# Patient Record
Sex: Male | Born: 1940 | Race: Black or African American | Hispanic: No | Marital: Married | Smoking: Former smoker
Health system: Southern US, Community
[De-identification: ages and names within clinical notes are randomized; demographics above are authoritative.]

## PROBLEM LIST (undated history)

## (undated) DIAGNOSIS — I219 Acute myocardial infarction, unspecified: Secondary | ICD-10-CM

## (undated) DIAGNOSIS — I10 Essential (primary) hypertension: Secondary | ICD-10-CM

## (undated) HISTORY — PX: OTHER SURGICAL HISTORY: SHX169

---

## 2003-07-06 ENCOUNTER — Inpatient Hospital Stay (HOSPITAL_COMMUNITY): Admission: EM | Admit: 2003-07-06 | Discharge: 2003-07-08 | Payer: Self-pay | Admitting: Emergency Medicine

## 2003-07-06 ENCOUNTER — Encounter: Payer: Self-pay | Admitting: Emergency Medicine

## 2003-07-07 ENCOUNTER — Encounter: Payer: Self-pay | Admitting: Cardiology

## 2007-07-16 ENCOUNTER — Ambulatory Visit: Payer: Self-pay | Admitting: Thoracic Surgery (Cardiothoracic Vascular Surgery)

## 2007-07-29 ENCOUNTER — Ambulatory Visit (HOSPITAL_COMMUNITY)
Admission: RE | Admit: 2007-07-29 | Discharge: 2007-07-29 | Payer: Self-pay | Admitting: Thoracic Surgery (Cardiothoracic Vascular Surgery)

## 2007-07-29 ENCOUNTER — Encounter: Payer: Self-pay | Admitting: Thoracic Surgery (Cardiothoracic Vascular Surgery)

## 2007-07-31 ENCOUNTER — Inpatient Hospital Stay (HOSPITAL_COMMUNITY)
Admission: RE | Admit: 2007-07-31 | Discharge: 2007-08-05 | Payer: Self-pay | Admitting: Thoracic Surgery (Cardiothoracic Vascular Surgery)

## 2007-07-31 ENCOUNTER — Ambulatory Visit: Payer: Self-pay | Admitting: Thoracic Surgery (Cardiothoracic Vascular Surgery)

## 2007-07-31 ENCOUNTER — Encounter: Payer: Self-pay | Admitting: Thoracic Surgery (Cardiothoracic Vascular Surgery)

## 2007-08-07 ENCOUNTER — Emergency Department (HOSPITAL_COMMUNITY): Admission: EM | Admit: 2007-08-07 | Discharge: 2007-08-07 | Payer: Self-pay | Admitting: Emergency Medicine

## 2007-08-25 ENCOUNTER — Ambulatory Visit: Payer: Self-pay | Admitting: Thoracic Surgery (Cardiothoracic Vascular Surgery)

## 2007-08-25 ENCOUNTER — Encounter
Admission: RE | Admit: 2007-08-25 | Discharge: 2007-08-25 | Payer: Self-pay | Admitting: Thoracic Surgery (Cardiothoracic Vascular Surgery)

## 2011-04-24 NOTE — Discharge Summary (Signed)
[name], ROSETE              ACCOUNT NO.:  [id number]   MEDICAL RECORD NO.:  [id number]          PATIENT TYPE:  INP   LOCATION:  2037                         FACILITY:  [facility]   PHYSICIAN:  Salvatore Decent. Cornelius Moras, M.D. DATE OF BIRTH:  [birth year]   DATE OF ADMISSION:  07/31/2007  DATE OF DISCHARGE:                               DISCHARGE SUMMARY   FINAL DIAGNOSIS:  Severe three-vessel coronary artery disease with  progressive symptoms of angina and mild to moderate left ventricular  dysfunction.   IN HOSPITAL DIAGNOSES:  1. Volume overload postoperatively.  2. Acute blood loss anemia postoperatively.   SECONDARY DIAGNOSES:  1. Hypertension.  2. Longstanding tobacco abuse.  3. Chronic low back pain with degenerative disk disease of lumbosacral      spine.  4. Status post back surgery x6.   IN HOSPITAL OPERATIONS AND PROCEDURES:  Coronary artery bypass grafting  x3 using a left internal mammary artery to distal left anterior  descending coronary artery, saphenous vein graft to ramus intermediate  branch, saphenous vein graft to first circumflex marginal branch,  endoscopic saphenous vein harvest from bilateral thighs and left lower  leg.   HISTORY OF PRESENT ILLNESS/HOSPITAL COURSE:  The patient is a 70-year-  old male with known previous history of coronary artery disease.  Risk  factors notable for history of hypertension, longstanding tobacco abuse  as well as a family history of coronary artery disease.  The patient  presents with a 33-month history of progressive symptoms of exertional  chest pain consistent with angina.  He underwent a nuclear stress test  that was abnormal.  The patient was then taken for cardiac  catheterization by  Dr. Georganna Skeans July 10, 2007.  The patient was found to have severe three-  vessel coronary artery disease with mild to moderate left ventricular  dysfunction.  Following catheterization Dr. Cornelius Moras was consulted.  Dr.  Cornelius Moras saw and evaluated the  patient in the office.  He discussed with the  patient undergoing coronary artery bypass grafting.  Risks and benefits  discussed with the patient.  The patient acknowledged understanding and  agreed to proceed.  Surgery was scheduled for July 31, 2007.  For  details of the patient's Past Medical History and Physical Exam, please  see dictated H&P.   The patient was taken to the operating room July 31, 2007 where he  underwent coronary artery bypass grafting x3 using a left internal  mammary artery to the distal left anterior descending coronary artery,  saphenous vein graft to ramus intermediate branch, saphenous vein graft  to first circumflex marginal branch.  Endoscopic saphenous vein harvest  from bilateral thighs and left lower leg.  The patient tolerated this  procedure.  Was transferred to the intensive care unit in stable  condition.  Postoperatively the patient was noted to be hemodynamically  stable.  He was extubated the evening of surgery.  Post extubation the  patient noted to be alert and oriented x4.  Neurological intact.  The  patient's postoperative course pretty much unremarkable.  Postop day #1  his vital signs are noted to be  stable.  He was able to be weaned from  all drips.  Swan-Ganz catheter DC'd in normal fashion.  Chest x-ray was  noted to be stable.  Minimal drainage from chest tube.  Chest tube CT  postop day #1.  The patient continued to progress well on the intensive  care unit.  His vital signs continued to remain stable.  He was out of  bed ambulating well.  Diet remained stable.  The patient was transferred  up to 2000 postop day #2.  While on telemetry floor the patient  continued to progress well.  He remained in normal sinus rhythm.  Pulmonary status remained stable.  Incisions were clean, dry, and intact  and healing well.  The patient continued to ambulate well with cardiac  rehab.  He was tolerating diet well.  No nausea, vomiting noted.   Postoperatively the patient did have slight acute blood loss anemia.  Postop day #1 hemoglobin and hematocrit were 8.6 and 24.9%.  The patient  was asymptomatic, and this was followed closely.  This did improve and  on postop day #3 was 9.8 and 28.5%.  The patient also developed slight  volume overload postoperatively.  He was started on diuretics to  continue while in the hospital.  The patient's weight was back near  baseline prior to discharge home.  Prior to discharge the patient  remained afebrile.  He was able to be weaned off oxygen satting greater  than 98% on room air.   LABORATORY DATA:  Laboratories postop day #3 showed a white count 11.3,  hemoglobin 9.85, hematocrit 28.5.  Sodium 139, potassium 4.2, chloride  98, bicarb 33, BUN 18, creatinine 1.17, glucose 121.   The patient is tentatively ready for discharge home in the a.m. postop  day #5, August 05, 2007.   FOLLOW-UP APPOINTMENTS:  Follow-up appointment has been arranged with  Dr. Cornelius Moras for August 25, 2007 at 1:15 p.m..  The patient will need to  obtain PMI chest x-ray 30 minutes prior to this appointment.  The  patient will need to follow up with  Dr. Georganna Skeans in two weeks.  He will need to contact Dr. Walden Field office  to make these arrangements.   ACTIVITY:  The patient instructed no driving until released to do so, no  heavy lifting over 10 pounds.  He is told to ambulate 3-4 times per day,  progress as tolerated, and continue his breathing exercises.   INCISIONAL CARE:  The patient told to shower washing his incisions using  soap and water.  He is to contact the office if he develops any drainage  or opening from any of his incision sites.   DIET:  The patient educated on diet to be low fat, low salt.   DISCHARGE MEDICATIONS:  1. Aspirin 81 mg daily.  2. Toprol XL 100 mg daily.  3. Lipitor 10 mg at night.  4. Plavix 75 mg daily.  5. Lasix 40 mg daily x5 days.  6. Potassium chloride 200 mg daily x5 days.   7. Ranitidine 150 mg b.i.d.  8. Benazepril/HCTZ 20/25 mg daily.  9. Clonazepam 1 mg at night as needed.  10.Oxycodone 5 mg 1-2 tabs q.4-6 h. p.r.n. pain.      Theda Belfast, PA      Salvatore Decent. Cornelius Moras, M.D.  Electronically Signed    KMD/MEDQ  D:  08/04/2007  T:  08/05/2007  Job:  [id number]   cc:   Nance Pew

## 2011-04-24 NOTE — Op Note (Signed)
NAMEDEQUON, SCHNEBLY              ACCOUNT NO.:  [id number]   MEDICAL RECORD NO.:  [id number]          PATIENT TYPE:  INP   LOCATION:  2311                         FACILITY:  [facility]   PHYSICIAN:  Salvatore Decent. Cornelius Moras, M.D. DATE OF BIRTH:  1940-12-12   DATE OF PROCEDURE:  07/31/2007  DATE OF DISCHARGE:                               OPERATIVE REPORT   PREOPERATIVE DIAGNOSIS:  Severe three-vessel coronary artery disease  with progressive symptoms of angina pectoris and mild to moderate left  ventricular dysfunction.   POSTOPERATIVE DIAGNOSIS:  Severe three-vessel coronary artery disease  with progressive symptoms of angina pectoris and mild to moderate left  ventricular dysfunction.   PROCEDURE:  Median sternotomy for coronary artery bypass grafting x3  (left internal mammary artery to distal left anterior descending  coronary artery, saphenous vein graft to ramus intermediate branch,  saphenous vein graft to first circumflex marginal branch, endoscopic  saphenous vein harvest from bilateral thighs and left lower leg).   SURGEON:  Salvatore Decent. Cornelius Moras, M.D.   ASSISTANT:  Rowe Clack, P.A.-C.   ANESTHESIA:  General.   BRIEF CLINICAL NOTE:  The patient is a 70 year old male with no previous  history of coronary artery disease but risk factors notable for history  of hypertension and longstanding tobacco abuse as well as a family  history of coronary artery disease.  The patient presents with a 43-month  history of progressive symptoms of exertional chest pain consistent with  angina pectoris.  He underwent a nuclear stress test that was abnormal.  The patient underwent elective cardiac catheterization by Dr. Danella Maiers on July 10, 2007.  He was found to have severe three-vessel  coronary artery disease with mild to moderate left ventricular  dysfunction.  A full consultation has been dictated previously.  The  patient and his wife have been counseled at length regarding the  indications, risks, and potential benefits of surgery.  Alternative  treatment strategies have been discussed.  They understand and accept  all associated risks of surgery and desire to proceed as described.   OPERATIVE FINDINGS:  1. Diffuse coronary artery disease with relatively poor target vessels      for grafting.  The terminal branches of the distal right coronary      artery were small, diffusely diseased, and not graftable.  The      diagonal branch off OF the left anterior descending coronary artery      was not graftable due to severe calcified atherosclerotic plaque.      The second circumflex marginal branch was not graftable.  2. Mild to moderate left ventricular dysfunction with ejection      fraction estimated at 40%.  3. Somewhat small-caliber left internal mammary artery that was      otherwise good-quality with excellent flow.  4. Small-caliber saphenous vein, much of which was too small to be      utilized as conduit for grafting.  Subsequently, all of the greater      saphenous vein from both thighs and the left lower leg was removed.      Below the  knee is a saphenous vein was all too small to be utilized      for grafting.  5. The patient probably should not be considered a candidate for redo      coronary artery bypass surgery in the future due to the very      diffuse nature of his coronary artery disease with lack of target      vessels for grafting and limited options for conduit.   OPERATIVE NOTE IN DETAIL:  The patient is brought to the operating room  on the above-mentioned date and central monitoring is established by the  anesthesia service under the care and direction of Dr. Sheldon Silvan.  Specifically, a Swan-Ganz catheter is placed through the right internal  jugular approach.  A radial arterial line is placed.  Intravenous  antibiotics are administered.  Following induction with general  endotracheal anesthesia, a Foley catheter is placed.  The  patient's  chest, abdomen, both groins, and both lower extremities are prepared and  draped in a sterile manner.  Baseline transesophageal echocardiogram is  performed by Dr. Ivin Booty.  The patient is noted to have mild to moderate  left ventricular dysfunction with ejection fraction estimated at 40%.  There is severe hypokinesis in the inferior wall.  There is mild mitral  regurgitation.   A median sternotomy incision is performed and the left internal mammary  artery is dissected from the chest wall and prepared for bypass  grafting.  The left internal mammary artery is somewhat small-caliber  but otherwise good-quality.  Simultaneously saphenous vein is obtained  initially from the left thigh and the left lower leg using endoscopic  vein harvest technique.  There is only one segment of saphenous vein  from the left thigh which is large enough to be utilized as conduit for  grafting.  Approximately half of the way down the thigh the vein becomes  small and below the knee, the vein is very small.  Subsequently an  additional segment of saphenous vein is obtained from the patient's  right thigh using endoscopic vein harvest technique.  The vein on the  right side is similar and at the level of the knee it becomes too small  to be utilized for grafting.  The portions of the saphenous vein which  are utilized for surgery are satisfactory conduit for grafting.  After  the saphenous vein is removed, the small incisions in both lower  extremities are closed in multiple layers with running absorbable  suture.  The patient is heparinized systemically and the left internal  mammary artery transected distally.  It is noted to have good flow.   The pericardium is opened.  The ascending aorta is mildly sclerotic but  otherwise normal in appearance.  The ascending aorta and the right  atrium are cannulated for cardiopulmonary bypass.  Adequate  heparinization is verified.  Cardiopulmonary bypass is  begun and the  surface of the heart is inspected.  Distal sites are selected for  coronary bypass grafting.  The patient has very diffuse coronary artery  disease with hard, calcified plaque throughout much of his epicardial  coronary arteries.  The terminal branches of the distal right coronary  artery are all tiny and the distal right coronary artery itself is  chronically occluded.  There are no suitable target vessels for grafting  in this vascular territory.  The diagonal branch off the left anterior  descending coronary artery is severely diseased and heavily calcified,  and there are no  areas suitable for grafting.   A temperature probe is placed in the left ventricular septum.  A  cardioplegia catheter is placed in the ascending aorta.  The patient is  allowed to cool passively to 32 degrees' systemic temperature.  The  aortic crossclamp is applied and cold blood cardioplegia is administered  in an antegrade fashion through the aortic root.  Iced saline slush is  applied for topical hypothermia.  The initial cardioplegic arrest and  myocardial cooling are felt to be excellent.  Repeat doses of  cardioplegia are administered intermittently throughout the crossclamp  portion of the operation through the aortic root and down the  subsequently-placed vein grafts to maintain left ventricular septal  temperature below 15 degrees centigrade.   The following distal coronary anastomoses are performed:  1. The first circumflex marginal branch is grafted with a saphenous      vein graft in an end-to-side fashion.  This vessel measures 1.5 mm      in diameter and is a good-quality target vessel for grafting at the      site of distal anastomosis.  2. The ramus intermediate branch is grafted with a saphenous vein      graft in an end-to-side fashion.  This vessel measures 1.8 mm in      diameter and is a good-quality target vessel for grafting.  It is      diffusely diseased proximally and  intramyocardial distally.  3. The distal left anterior descending coronary artery is grafted with      the left internal mammary artery in an end-to-side fashion.  This      vessel is small and only a fair-quality target vessel for grafting.      It measures one 1.2 mm in diameter at the distal anastomoses.  A      1.0 probe will pass in both directions and all way to the apex of      the heart.   Both proximal saphenous vein anastomoses are performed directly to the  ascending aorta prior to removal of the aortic crossclamp.  The left  ventricular septal temperature rises appropriately with reperfusion of  the left internal mammary artery.  The aortic crossclamp is removed  after a total crossclamp time of 84 minutes.  All proximal and distal  anastomoses are inspected for hemostasis and appropriate graft  orientation.  The patient's heart began to beat spontaneously without  need for cardioversion.  Epicardial pacing wires are affixed to the  right ventricular outflow tract and to the right atrial appendage.  Low-  dose dopamine infusion is begun.  The patient is weaned from  cardiopulmonary bypass on a low-dose dopamine infusion.  Total  cardiopulmonary bypass time for the operation is 116 minutes.  Follow-up  transesophageal echocardiogram initially following separation from  bypass demonstrates mild to moderate mitral regurgitation with  persistent inferior wall hypokinesis.  This improves and mitral  regurgitation resolves as the patient stabilizes following separation  from bypass.  Prior to transport, there is trivial residual mitral  regurgitation.   The venous and arterial cannulae are removed uneventfully.  Protamine is  administered to reverse the anticoagulation.  The mediastinum and the  left chest are irrigated with saline solution containing vancomycin.  Meticulous surgical hemostasis is ascertained.  The mediastinum and both  left and right pleural spaces are drained  with four chest tubes exited  through separate stab incisions inferiorly.  The pericardium and soft  tissues anterior to the aorta  are reapproximated loosely.  The On-Q  continuous pain management system is utilized to facilitate  postoperative pain control.  Two 10-inch catheters supplied with the On-  Q kit are tunneled into the deep subcutaneous tissues and positioned  just lateral to the lateral border of the sternum on either side.  Each  catheter is flushed with 5 mL of 0.5% bupivacaine solution and  ultimately they are connected to a continuous infusion pump.  The  sternum was closed with double-strength sternal wire.  The soft tissues  anterior to the sternum are closed in multiple layers and the skin is  closed with a running subcuticular skin closure.   The patient tolerated the procedure well and is transported to the  surgical intensive care unit in stable condition.  There are no  intraoperative complications.  All sponge, instrument and needle counts  are verified correct at completion of the operation.  No blood products  were administered.      Salvatore Decent. Cornelius Moras, M.D.  Electronically Signed     CHO/MEDQ  D:  07/31/2007  T:  08/01/2007  Job:  [id number]   cc:   Danella Maiers, MD  Lorelei Pont, MD

## 2011-04-24 NOTE — Assessment & Plan Note (Signed)
OFFICE VISIT   [name], [name]  DOB:  07-31-1941                                        July 16, 2007  CHART #:  [id number]   HISTORY OF PRESENT ILLNESS:  [name] is a 70 year old disabled  African-American male from [place], [place] with no previous history of  coronary artery disease, but risk factors notable for history of  hypertension and long standing tobacco abuse. The patient also has a  family history of coronary artery disease. The patient has been disabled  for several years due to chronic pain in his lower back. 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 As part of his  rehab from these procedures, the patient has been on a walking regimen,  and over the last four months he has noticed progressive symptoms of  chest pain that initially was brought on with walking. He describes  these pains as a tightness that radiates across his chest and sometimes  down his left arm. They are sometimes associated with sharp pains that  typically radiate from right to left. The patient also has some  epigastric burning pain that seems to come and go. Typically, the  patient's symptoms are relieved with rest. Recently, he has had some  nocturnal symptoms that have awoken him from his sleep. He denies any  prolonged episodes of chest pain lasting more than 5 or 10 minutes at  the most. He does report some progressive exertional shortness of  breath. He denies resting shortness of breath, PND, orthopnea, or lower  extremity edema.   He underwent a nuclear stress test on July 07, 2007. The patient did not  develop chest pain during the exam, nor did he develop significant EKG  changes. However, there was an area of apical ischemia on nuclear  imaging. Resting ejection fraction was 65%. The patient subsequently  underwent elective cardiac catheterization by Dr. Danella Maiers on July 10, 2007. He was found to have severe three vessel coronary artery  disease with mild to moderate left ventricular dysfunction. Coronary  anatomy was felt to be unfavorable for percutaneous coronary  intervention. [name] has now been referred for possible elective  surgical revascularization.   REVIEW OF SYSTEMS:  GENERAL:  The patient reports normal appetite. He  has lost 6 or 8 pounds over the last several months, which he attributes  to his exercise program.  CARDIAC:  Notable for progressive symptoms of angina. These have become  more frequent, now occurring nearly every day. The patient reports mild  exertional shortness of breath, functional class 2. The patient denies  resting shortness of breath, PND, orthopnea, or lower extremity edema.  The patient reports occasional tachy-palpitations. He denies any dizzy  spells or syncopal episodes.  RESPIRATORY:  Notable for intermittent dry non-productive cough that  seems to come and go sporadically. The patient denies productive cough,  hemoptysis, or wheezing.  GASTROINTESTINAL:  Notable for some symptoms of epigastric burning pain  that has been attributed to indigestion. The patient has no difficulty  swallowing. He denies hematochezia, hematemesis, melena. He has some  mild chronic constipation.  GENITOURINARY:  Negative. The patient denies hematuria or urinary  urgency or frequency.  PERIPHERAL VASCULAR:  Negative. The patient denies  symptoms suggestive  of claudication.  NEGATIVE:  Negative. The patient denies transient numbness or weakness  involving either the upper or lower extremity.  MUSCULOSKELETAL:  Notable for chronic pain in his lower back that has  limited his activity. He is up and ambulatory, and he is getting around  reasonably well presently, although he still has chronic pain in his  back.  PSYCHIATRIC:  Negative. The patient denies problems with anxiety or  depression.  HEENT:  Notable in  that the patient has a partial set of dentures with 4  or 5 remaining teeth. He denies any ongoing dental issues.   PAST MEDICAL HISTORY:  1. Hypertension.  2. Long standing tobacco abuse.  3. 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   PAST SURGICAL HISTORY:  Back surgery x6.   FAMILY HISTORY:  The patient's father died of a myocardial infarction  age 11; several other family members have coronary artery disease.   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 The patient is married  and lives with his wife in [place]. They have four grown children.  He has smoked cigarettes for the past 50 years averaging at least one  pack of cigarettes per day, although the patient reports that he has cut  back recently. The patient denies excessive alcohol consumption.   CURRENT MEDICATIONS:  1. Ranitidine 150 mg twice daily.  2. Reglan 10 mg before meals and at bedtime.  3. Benazepril/hydrochlorothiazide 20/25 mg 1 tablet every morning.  4. Clonazepam 1 mg at bedtime.   DRUG ALLERGIES:  None known.   PHYSICAL EXAMINATION:  GENERAL:  The patient is a well appearing African-  American male who appears slightly older than his stated age, but in no  acute distress.  VITAL SIGNS:  Blood pressure 154/93, pulse 91, oxygen saturation 96% on  room air.  HEENT:  Grossly unrevealing.  NECK:  Supple. There is no cervical or supraclavicular lymphadenopathy.  There is no jugular venous distension.  CHEST:  Auscultation of the chest demonstrates clear breath sounds which  are symmetrical bilaterally. There is no wheezing or rhonchi noted.  CARDIOVASCULAR:  Regular rate and rhythm. No murmurs, rubs, or gallops  are appreciated.  ABDOMEN:  Mildly obese, but soft and nontender. Bowel sounds are  present. There are no palpable masses.  EXTREMITIES:  Warm and adequately profuse. There  is no lower extremity  edema. Distal pulses are diminished in both lower legs at the ankle.  There is no sign of significant venous insufficiency.  SKIN:  Clean, dry, healthy appearing throughout with no open skin  lesions or ulcerations. There are a few patches of discoloration  consistent with tinea versicolor.  NEUROLOGIC:  Grossly non-focal and symmetrical throughout.  RECTAL:  Deferred.  GU:  Deferred.   DIAGNOSTIC TESTS:  Cardiac catheterization performed by Dr. Georganna Skeans on  July 31 is reviewed. This demonstrates severe three vessel coronary  artery disease with mild to moderate left ventricular dysfunction.  Specifically, there is diffuse disease involving the left anterior  descending coronary artery with discreet 90% stenosis of the proximal  portion of this vessel. There is 100% chronic occlusion of the left  anterior descending coronary artery just after take off of a medium  sized second diagonal branch. There is left to left collateral filling  of the distal left anterior descending coronary artery which wraps the  apex at the heart. There is 90% proximal stenosis of the large first  circumflex marginal branch and long segment 80% proximal stenosis of the  large second circumflex marginal branch. There is 70% diffuse long  segment proximal stenosis of a medium to large sized ramus intermediate  branch. There is 100% chronic occlusion of the right coronary artery.  Left ventricular function is mildly reduced. There is antra apical and  inferior apical hypokinesis. Ejection fraction is estimated 45% to 50%.  Left ventricular and diastolic pressure with 14 millimeters of mercury.   IMPRESSION:  Severe three vessel coronary artery disease with  progressive symptoms of angina pectoris now with nocturnal symptoms.  There is mild left ventricular dysfunction. Coronary anatomy would not  suitable for percutaneous coronary intervention. I believe that Mr.  Serrano would best  treated with coronary artery bypass grafting.   PLAN:  I have discussed the options at length with [name] and his  family here in the office today. Alternative treatment strategies have  been discussed. They understand and accept all of the associated risks  of surgery including, but not limited to, the risk of death, stroke,  myocardial infarction, congestive heart failure, respiratory failure,  pneumonia, bleeding requiring blood transfusion, arrhythmia, infection,  and recurrent coronary artery disease. All of their questions have been  addressed. We tentatively plan to proceed with surgery on Thursday,  August 21. [name] has been prescriptions for several new  medications to take between now and the time of surgery including Toprol  XL 25 mg daily, Imdur 30 mg daily, and Ultram tablets as necessary for  chronic back pain. He has been advised to take enteric coated aspirin 81  mg daily. He has also been given a prescription for nitroglycerin  sublingual capsules to be utilized should he develop chest pain at rest.  He has been instructed to contact EMS and get to the nearest emergency  room if he develops a prolonged episode of chest pain unrelieved with  sublingual nitroglycerin. All of his questions have been addressed.   Salvatore Decent. Cornelius Moras, M.D.  Electronically Signed   CHO/MEDQ  D:  07/16/2007  T:  07/17/2007  Job:  [id number]   cc:   Danella Maiers, MD  Lorelei Pont, MD

## 2011-04-24 NOTE — Assessment & Plan Note (Signed)
OFFICE VISIT   [name], [name]  DOB:  [birth year]                                        August 25, 2007  CHART #:  [id number]   REPORT TITLE:  OFFICE NOTE   HISTORY OF PRESENT ILLNESS:  The patient returns for a routine followup  status post coronary artery bypass grafting times 3 on 07/31/2007.  His  postoperative recovery has been uneventful.  Findings at the time of  surgery were notable for diffuse coronary artery disease, with  relatively poor target vessels for grafting.  In addition, he had  relatively small caliber saphenous vein, and as such he would not be  considered a candidate for redo coronary artery bypass surgery in the  future due to the very diffuse nature of his history coronary artery  disease with limited options for conduit.  His postoperative recovery  has been uneventful.  Following hospital discharge he seems to have  continued to slowly improve.  He has been seen in followup by Dr.  Georganna Skeans who changed his Lipitor to lovastatin in an effort to save  financially.  The patient returns to our office today for routine  followup.  Overall he has no specific complaints.  He has mild residual  soreness in his chest that continues to slowly improve.  He has not had  any shortness of breath.  He is eating fairly well, and he wants to  extend his diet to include some of the foods that he has been advised to  try to stay away from.  He has had some mild soreness in his legs as  well, particularly his right thigh, but this also seems to improve the  more and more that he has been active.  He denies any fevers or chills.  He is sleeping well at night.  The remainder of his review of systems is  unrevealing.   PHYSICAL EXAMINATION:  GENERAL AND VITAL SIGNS:  Physical exam is  notable for a well-appearing male, with blood pressure 157/86, pulse 65,  oxygen saturation 97% on room air.  CHEST:  Examination is notable for  median sternotomy  incision that is healing nicely.  The sternum is  stable on palpation.  Breath sounds are clear to auscultation  bilaterally.  No wheezes are rhonchi are noted.  CARDIOVASCULAR:  Exam  includes a regular rate and rhythm.  No murmurs, rubs, or gallops are  appreciated.  ABDOMEN:  Soft, and nontender.  EXTREMITIES:  Warm and  well perfused.  The small incisions from bilateral endoscopic vein  harvest have healed nicely.  There is no lower extremity edema.  The  remainder of his physical exam is unremarkable.   IMPRESSION:  Satisfactory progress following recent coronary artery  bypass grafting.   PLAN:  I have encouraged the patient to continue to gradually increase  his activity as tolerated, with his only limitation at this point  remaining that he refrain from heavy lifting or strenuous use of his  arms or shoulders for at least another 2 months.  I have encouraged him  to consider enrolling in the cardiac rehab program, although he seems  reluctant to do so given the fact that he does not have any health  insurance.  I have instructed him to increase his activity and to try to  make an effort  to walk at least 2 or 3 times every day.  All of his  questions have been addressed.  We have not made any changes in his  current medications.  He was sent home on Plavix due to concerns  regarding his very diffuse coronary artery disease, with the potential  for premature graft failure, and/or premature progression of his  underlying coronary artery disease.  There is no hard or fast data to  support this use, and given his limited financial resources the  potential risks may outweigh the benefits.  We will leave this decision  to the discretion of Dr. Georg[facility].  In the future the  patient will call and return to see use here at [facility] only should further problems or difficulties arise.   Salvatore Decent. Cornelius Moras, M.D.  Electronically Signed   CHO/MEDQ   D:  08/25/2007  T:  08/25/2007  Job:  [id number]   cc:   Danella Maiers, MD  Lorelei Pont, MD

## 2011-04-27 NOTE — Discharge Summary (Signed)
[name], [name]                          ACCOUNT NO.:  [id number]   MEDICAL RECORD NO.:  [id number]                   PATIENT TYPE:  INP   LOCATION:  0347                                 FACILITY:  [facility]   PHYSICIAN:  Darden Palmer., M.D.         DATE OF BIRTH:  [birth year]   DATE OF ADMISSION:  07/06/2003  DATE OF DISCHARGE:  07/08/2003                                 DISCHARGE SUMMARY   FINAL DIAGNOSES:  1. Chest pain of uncertain etiology, myocardial infarction ruled out.  2. Cholelithiasis.  3. Renal cyst.  4. Hypertension.   PROCEDURE:  Echocardiogram Cardiolite scan.   HISTORY:  The patient is a 70 year old black male with a prior history of  hypertension and smoking. He presented to the emergency room with a one-year  history of chest discomfort. This has occurred several times for a week and  have been somewhat random. They are unrelated to position or activity but  may have some relation to food. He also will occasionally have some right  upper quadrant pain. He is [place] visiting family, had the onset of  chest discomfort, and was brought to the emergency room where he was  admitted to rule out a MI. Please see the previously dictated history and  physical for remainder of the details.   HOSPITAL COURSE:  The patient was initiated on Lovenox. He complained of  some abdominal pain in his right upper quadrant as well as some chest pain.  The pain was not related to activity. His cardiac enzymes were all negative,  and EKG was normal. A CT scan was obtained of the chest that did not show  any evidence of dissection. He had gallstones noted on ultrasound. There  were bilateral inguinal hernias noted, and there was no evidence for an  abdominal aneurysm. There was a right renal cyst that was also identified.  Ultrasound confirmed the presence of gallstones. EKG showed somewhat poor R-  wave progression with no significant abnormalities. His CBC was normal,  and  his chemistry panel was normal on admission with normal liver enzymes. Lipid  panel showed a cholesterol of 159, triglycerides of 124, HDL of 33, and a  LDL of 101. Urinalysis was unremarkable. He underwent an adenosine  Cardiolite scan on July 07, 2003 and had transient AV block with it. There  was not felt to be any evidence of ischemia. Ejection fraction was 46% which  is slightly depressed. It was thought that much of his pain was atypical and  possibly represented some GI component. Dr. Jamey Ripa saw the patient and  evaluated him and saw that he had significant gallstones and thought the  chest pain may be biliary colic. He thought that he would need to have a  cholecystectomy. A hepatobiliary scan was supposed to be performed in the  morning, and he is also seen by the urologist. He had a renal cyst that was  complex  and bilateral simple cysts and benign prostatic hypertrophy. Dr.  Aldean Ast wanted to follow this in his office. It was thought that the  patient could have his cholecystectomy done as an outpatient. Dr. Jamey Ripa did  not feel it needed to be done emergently. He was discharged to continue his  previous home medications including Mavik one daily. He is to follow up with  Dr. Jamey Ripa for a possibly cholecystectomy. He is to follow up with his  primary care doctor at home.                                                Darden Palmer., M.D.   WST/MEDQ  D:  07/28/2003  T:  07/29/2003  Job:  [id number]

## 2011-04-27 NOTE — Consult Note (Signed)
NAMEMEARLE, DREW                          ACCOUNT NO.:  [id number]   MEDICAL RECORD NO.:  [id number]                   PATIENT TYPE:  INP   LOCATION:  0347                                 FACILITY:  [facility]   PHYSICIAN:  Currie Paris, M.D.           DATE OF BIRTH:  1941/11/12   DATE OF CONSULTATION:  07/06/2003  DATE OF DISCHARGE:                                   CONSULTATION   REASON FOR CONSULTATION:  Atypical chest pain and gallstones.   HISTORY OF PRESENT ILLNESS:  I was asked by Dr. Donnie Aho to evaluate Mr.  Martie Round for a possible cholecystectomy.  He is a 70 year old gentleman  admitted yesterday for evaluation of chest pain.  The patient gives a  history of intermittent episodes of pain over really the last year, although  maybe more frequently over the last several weeks, and apparently 4-5 times  a day the week prior to coming into the emergency room.  He really could not  relate them to meals or anything else.  It seemed to be across his anterior  chest, at least according to the note at his admission, but today he  describes it really more substernal.  It did not radiate and did not seem to  be associated with any other GI symptoms.  Most of the time these episodes  wound regress, and he does say that he has been to the emergency room a  couple of times because of these without being told that he just had  indigestion.   PAST HISTORY:  The patient has been on [facility] for blood pressure.   ALLERGIES:  None known.   SURGERIES:  Had no prior abdominal surgery.  He has had five different back  surgeries.   SOCIAL HISTORY:  The patient lives with his wife.  He is disabled secondary  to his back issues.   REVIEW OF SYSTEMS:  HEENT:  Negative.  Chest:  No cough or other respiratory  symptoms.  Heart:  Current history of chest pain and hypertension; otherwise  negative.  Abdomen:  Does have intermittent problems with some mild  bloating.  No problems with his  stools.  No change in his bowel habits.  No  blood in his stool.  No throwing up of blood.  GU:  Negative.  Extremities:  Negative, other than history of back problems.   PHYSICAL EXAMINATION:  General:  Today, the patient is well-developed, well-  nourished, alert and oriented x3.  Vital signs:  Stable and within normal  limits, except for slightly slow pulse since admission.  Head:  Normocephalic.  Eyes - pupils equal, round, and regular.  Nonicteric  sclerae.  Neck:  Supple.  No masses or thyromegaly noted.  Chest:  Clear to  auscultation.  Heart:  No murmurs, rubs, or gallops are heard.  Normal  carotid femoral pulses.  No bruits noted.  Abdomen:  Soft and completely  nontender.  I can palpate quite deeply in the right upper quadrant, and  there is no tenderness and no masses or hepatosplenomegaly or rebound.  There are no hernias noted.  The bowel sounds are okay.  Rectal:  Not done.  Extremities:  No cyanosis or edema.  Good pulses.   LABORATORY DATA:  Reviewed.  EKG was normal.  Cardiolite study has been done  and is normal.  White count was normal.  Liver functions are normal.  CT of  the abdomen shows what appear to be some gallstones, as well as some sort of  a cystic area in the right kidney (GU consultation is currently pending).  Gallbladder ultrasound verbal report shows that he has got gallstones  without evidence of acute cholecystitis, but the written report is not  available, and these films are at [facility] with the patient being at  [facility], so they are not immediately available for review.   IMPRESSION:  1. Chest pain atypical for cardiac, and may well represent episodes of     biliary colic.  2. Cholelithiasis.  3. Hypertension.  4. Complex renal cyst of uncertain etiology.   PLAN:  I would like to go ahead and repeat a CBC in the morning to make sure  he is not developing a white count, and proceed to get a hepatobiliary scan.  If the hepatobiliary scan is  positive, I think we will need to proceed with  a laparoscopic cholecystectomy on a more urgent basis.  If it is negative,  again I would like to try to get him scheduled electively in the next week  or two, depending on OR availability.  In addition, we need to delay the  final decision until GU consultation is available to see if further  evaluation needs to be done on his right kidney.  Dr. Donnie Aho has felt that  he is stable from a cardiac standpoint to proceed to surgery.                                               Currie Paris, M.D.    CJS/MEDQ  D:  07/07/2003  T:  07/07/2003  Job:  [id number]

## 2011-04-27 NOTE — H&P (Signed)
NAMERICARD, FAULKNER                          ACCOUNT NO.:  [id number]   MEDICAL RECORD NO.:  [id number]                   PATIENT TYPE:  INP   LOCATION:  0347                                 FACILITY:  [facility]   PHYSICIAN:  Quita Skye. Waldon Reining, MD             DATE OF BIRTH:  1941/07/29   DATE OF ADMISSION:  07/06/2003  DATE OF DISCHARGE:                                HISTORY & PHYSICAL   [name] is a 70 year old black man who was admitted to [facility] for further evaluation of chest pain.   The patient, who has no past history of cardiac disease, presented to the  emergency department with a one-year history of chest pain.  Initially  episodes were occurring several times a week.  For the last week, he has  experienced four to five episodes of chest pain a day.  Episodes occur at  random and appear to be unrelated to position, activity, meals, or  respirations.  The chest pain is described as a sharp discomfort located in  a broad swath across his anterior chest.  It does not radiate.  It is  associated with dyspnea but no diaphoresis or nausea.  Episodes last  approximately 30 minutes and resolve spontaneously.  There are no  exacerbating or ameliorating factors.  He is free of chest pain at this  time.  He reports that he had some sort of stress testing several months ago  and that it did not demonstrate heart disease.   As noted, the patient has no past history of cardiac disease, including no  history of myocardial infarction, coronary artery disease, congestive heart  failure, or arrhythmia.  He does have a history of hypertension.  There is  no history of hyperlipidemia, diabetes mellitus, smoking, or family history  of early coronary artery disease.  He has a history of six prior back  operations.  He also has a history of gastroesophageal reflux.   The patient's only medication is Mavik for blood pressure.   He is not allergic to any medications.   The patient is disabled due to his back problems.  He lives with his wife.   REVIEW OF SYSTEMS:  No problems related to his head, eyes, nose, mouth,  throat, lungs, gastrointestinal system, genitourinary system, or  extremities.  There is no history of a neurologic or psychiatric disorder.  There is no history of fever, chills, or weight loss.   PHYSICAL EXAMINATION:  VITAL SIGNS:  Blood pressure 145/72.  Pulse 59 and  regular.  GENERAL:  The patient was an older black man in no discomfort.  He was  alert, oriented, appropriate, and responsive.  HEENT:  His head, eyes, nose, and mouth were unremarkable.  NECK:  Without thyromegaly or adenopathy.  Carotid pulses were palpable  bilaterally and without bruits.  CARDIAC:  A normal S1 and S2.  There was no S3, S4, murmur,  rub, or click.  Cardiac rhythm was regular.  CHEST:  No chest wall tenderness was noted.  The lungs were clear.  ABDOMEN:  Soft and nontender.  There was no mass, hepatosplenomegaly, bruit,  distention, rebound, guarding, or rigidity.  Bowel sounds were normal.  RECTAL, GENITOURINARY:  Not performed, as they are not pertinent to the  reason for acute care hospitalization.  EXTREMITIES:  Without edema, deviation, or deformity.  Radial and dorsalis  pedal pulses were palpable bilaterally.   The electrocardiogram was normal.  The chest radiograph report was pending  at the time of this dictation.  Initial CK was 134 with a CK-MB of 3.1 and  troponin of 0.01.  White count was 8.2 with a hemoglobin of 14.5 and a  hematocrit of 42.8.  Potassium was 4.2, BUN 15, and creatinine 0.9.  The  remaining studies were pending at the time of this dictation.   IMPRESSION:  1. Chest pain, rule out coronary artery disease.  2. Hypertension.  3. Gastroesophageal reflux.   PLAN:  1. Telemetry.  2. Serial cardiac enzymes.  3. Aspirin.  4. Lovenox.  5. Nitrol paste.  6. Chest and abdominal CT scans to rule out aneurysm, dissection,  and     pulmonary embolus.  7. Stress Cardiolite.  8. Fasting lipid profile.                                               Quita Skye. Waldon Reining, MD    MSC/MEDQ  D:  07/06/2003  T:  07/07/2003  Job:  [id number]   cc:   Quita Skye. Waldon Reining, MD  [address]  [place], [place] [address]  Fax: [phone]

## 2011-04-27 NOTE — Consult Note (Signed)
[name], [name] NO.:  [id number]   MEDICAL RECORD NO.:  [id number]                   PATIENT TYPE:  OUT   LOCATION:  NUC                                  FACILITY:  [facility]   PHYSICIAN:  Rozanna Boer., M.D.      DATE OF BIRTH:  1941/03/18   DATE OF CONSULTATION:  07/07/2003  DATE OF DISCHARGE:                                   CONSULTATION   REASON FOR CONSULTATION:  Abnormal CT scan with complex right renal cyst.   HISTORY OF PRESENT ILLNESS:  This 70 year old black male from [place],  [place] was admitted a couple of days ago with some intermittent abdominal  pain that he has had off and on for a year. His Cardiolite study was  negative, it was not thought to represent any cardiovascular problem. He has  had similar episodes with a trip to the ER and was found to have some  biliary colic and is being evaluated for possible cholecystectomy by Dr.  Jamey Ripa. He does have some gallstones but not a thick walled gallbladder.  Liver function tests were normal, WBC is also normal. A CT scan was done as  part of his evaluation which showed a complex cyst of the right kidney, this  was about 4 x 5 cm in the inferior pole on the right. It had very slight  enhancement with dye. He had some simple bilateral cysts as well. No  hydronephrosis, no other renal masses or stones were seen.   The patient denies urinary problems, gets up 1-2 times a night but has done  so for years and no history of stones or UTI or hematuria. He is divorced,  lives with girlfriend and has four children. The rest of his family history  and social history are noted on the previous admission.   PHYSICAL EXAMINATION:  VITAL SIGNS:  Stable.  ABDOMEN:  Soft with a little tenderness in the right upper quadrant. He does  have some bilateral inguinal hernias. Liver and spleen are not enlarged. No  CVA pain.  GU:  Uncircumcised penis, bilaterally distended testes.  Epididymis nontender  and his prostate is slightly enlarged, soft and benign.   His H&H is normal with 14.5  hemoglobin, hematocrit 42.8.   I reviewed the CT scan and agree with the diagnosis and feel he just needs  to have followup in about three months with probably another CT scan or  maybe just an ultrasound.   IMPRESSION:  1. Right renal complex cyst inferior fold 5 x 4 cm.  2. Bilateral simple cyst.  3. Benign prostatic hypertrophy on conservative management.   RECOMMENDATIONS:  Followup CT or ultrasound in my office in three months.  Rozanna Boer., M.D.    HMK/MEDQ  D:  07/07/2003  T:  07/07/2003  Job:  [id number]

## 2011-09-21 LAB — CBC
HCT: 24.9 — ABNORMAL LOW
HCT: 28.4 — ABNORMAL LOW
HCT: 28.5 — ABNORMAL LOW
HCT: 28.9 — ABNORMAL LOW
HCT: 30.6 — ABNORMAL LOW
HCT: 42.8
Hemoglobin: 10.5 — ABNORMAL LOW
Hemoglobin: 9.7 — ABNORMAL LOW
MCHC: 33.5
MCHC: 34.2
MCHC: 34.2
MCHC: 34.4
MCV: 92.8
MCV: 92.8
MCV: 92.8
MCV: 93.2
MCV: 93.6
Platelets: 106 — ABNORMAL LOW
Platelets: 121 — ABNORMAL LOW
Platelets: 126 — ABNORMAL LOW
Platelets: 126 — ABNORMAL LOW
Platelets: 139 — ABNORMAL LOW
Platelets: 186
Platelets: 295
RBC: 2.68 — ABNORMAL LOW
RBC: 3.05 — ABNORMAL LOW
RDW: 12.2
RDW: 12.4
RDW: 12.5
WBC: 11.3 — ABNORMAL HIGH
WBC: 11.6 — ABNORMAL HIGH
WBC: 11.9 — ABNORMAL HIGH
WBC: 8.3
WBC: 8.4
WBC: 9.6

## 2011-09-21 LAB — POCT I-STAT 3, ART BLOOD GAS (G3+)
Acid-base deficit: 2
Bicarbonate: 21.8
O2 Saturation: 100
O2 Saturation: 94
O2 Saturation: 96
Operator id: 203371
Patient temperature: 37.5
TCO2: 23
TCO2: 24
pCO2 arterial: 32.5 — ABNORMAL LOW
pCO2 arterial: 37.5
pH, Arterial: 7.399
pH, Arterial: 7.426
pO2, Arterial: 90

## 2011-09-21 LAB — POCT I-STAT 4, (NA,K, GLUC, HGB,HCT)
Glucose, Bld: 108 — ABNORMAL HIGH
Glucose, Bld: 112 — ABNORMAL HIGH
Glucose, Bld: 116 — ABNORMAL HIGH
Glucose, Bld: 152 — ABNORMAL HIGH
Glucose, Bld: 82
HCT: 22 — ABNORMAL LOW
HCT: 23 — ABNORMAL LOW
HCT: 32 — ABNORMAL LOW
Hemoglobin: 10.9 — ABNORMAL LOW
Hemoglobin: 7.5 — CL
Hemoglobin: 7.8 — CL
Operator id: 3342
Operator id: 3342
Operator id: 3342
Operator id: 3342
Potassium: 3.5
Potassium: 3.6
Potassium: 4.6
Sodium: 135
Sodium: 135
Sodium: 137
Sodium: 140

## 2011-09-21 LAB — I-STAT EC8
Acid-base deficit: 2
Bicarbonate: 22.9
Chloride: 104
Glucose, Bld: 155 — ABNORMAL HIGH
Glucose, Bld: 88
Potassium: 3.9
Potassium: 4
TCO2: 24
pCO2 arterial: 35.7
pCO2 arterial: 39
pH, Arterial: 7.376
pH, Arterial: 7.414

## 2011-09-21 LAB — BLOOD GAS, ARTERIAL
O2 Saturation: 95.7
Patient temperature: 98.6
TCO2: 26.9
pH, Arterial: 7.417

## 2011-09-21 LAB — MAGNESIUM
Magnesium: 2.2
Magnesium: 2.9 — ABNORMAL HIGH

## 2011-09-21 LAB — COMPREHENSIVE METABOLIC PANEL
AST: 22
Albumin: 3.8
BUN: 16
Calcium: 9.5
Chloride: 104
Creatinine, Ser: 1.13
GFR calc Af Amer: >60
Total Protein: 7.3

## 2011-09-21 LAB — POCT I-STAT GLUCOSE
Glucose, Bld: 96
Operator id: 153981

## 2011-09-21 LAB — BASIC METABOLIC PANEL
BUN: 11
BUN: 18
CO2: 27
CO2: 33 — ABNORMAL HIGH
Chloride: 112
Chloride: 98
GFR calc Af Amer: >60
GFR calc non Af Amer: >60
Glucose, Bld: 121 — ABNORMAL HIGH
Potassium: 3.3 — ABNORMAL LOW
Potassium: 3.7
Potassium: 4.2
Sodium: 140

## 2011-09-21 LAB — CREATININE, SERUM
Creatinine, Ser: 1.11
GFR calc Af Amer: >60
GFR calc non Af Amer: 57 — ABNORMAL LOW
GFR calc non Af Amer: >60

## 2011-09-21 LAB — I-STAT 8, (EC8 V) (CONVERTED LAB)
Acid-Base Excess: 3 — ABNORMAL HIGH
Chloride: 104
HCT: 33 — ABNORMAL LOW
Operator id: 277751
Potassium: 4.5
pH, Ven: 7.46 — ABNORMAL HIGH

## 2011-09-21 LAB — PROTIME-INR
INR: 0.9
Prothrombin Time: 12.7
Prothrombin Time: 16.7 — ABNORMAL HIGH

## 2011-09-21 LAB — TYPE AND SCREEN
ABO/RH(D): A POS
Antibody Screen: NEGATIVE

## 2011-09-21 LAB — DIFFERENTIAL
Basophils Absolute: 0
Basophils Relative: 0
Neutro Abs: 6.5
Neutrophils Relative %: 67

## 2011-09-21 LAB — POCT I-STAT 3, VENOUS BLOOD GAS (G3P V)
Acid-base deficit: 3 — ABNORMAL HIGH
Operator id: 3342
pH, Ven: 7.35 — ABNORMAL HIGH

## 2011-09-21 LAB — URINALYSIS, ROUTINE W REFLEX MICROSCOPIC
Glucose, UA: NEGATIVE
Ketones, ur: NEGATIVE
Protein, ur: NEGATIVE
Urobilinogen, UA: 0.2

## 2011-09-21 LAB — CK TOTAL AND CKMB (NOT AT ARMC)
Relative Index: INVALID
Total CK: 77

## 2011-09-21 LAB — APTT: aPTT: 23 — ABNORMAL LOW

## 2011-09-21 LAB — PREPARE PLATELET PHERESIS

## 2011-09-21 LAB — POCT I-STAT CREATININE: Operator id: 277751

## 2011-09-21 LAB — POCT CARDIAC MARKERS: Troponin i, poc: <0.05

## 2011-09-21 LAB — HEMOGLOBIN A1C: Mean Plasma Glucose: 129

## 2013-12-04 ENCOUNTER — Emergency Department (HOSPITAL_COMMUNITY): Payer: Medicare Other

## 2013-12-04 ENCOUNTER — Other Ambulatory Visit: Payer: Self-pay

## 2013-12-04 ENCOUNTER — Emergency Department (HOSPITAL_COMMUNITY)
Admission: EM | Admit: 2013-12-04 | Discharge: 2013-12-04 | Disposition: A | Discharge status: Home / Self Care | Payer: Medicare Other | Attending: Emergency Medicine | Admitting: Emergency Medicine

## 2013-12-04 ENCOUNTER — Encounter (HOSPITAL_COMMUNITY): Payer: Self-pay | Admitting: Emergency Medicine

## 2013-12-04 DIAGNOSIS — Z79899 Other long term (current) drug therapy: Secondary | ICD-10-CM | POA: Insufficient documentation

## 2013-12-04 DIAGNOSIS — Z87891 Personal history of nicotine dependence: Secondary | ICD-10-CM | POA: Insufficient documentation

## 2013-12-04 DIAGNOSIS — I10 Essential (primary) hypertension: Secondary | ICD-10-CM | POA: Insufficient documentation

## 2013-12-04 DIAGNOSIS — K279 Peptic ulcer, site unspecified, unspecified as acute or chronic, without hemorrhage or perforation: Secondary | ICD-10-CM | POA: Insufficient documentation

## 2013-12-04 DIAGNOSIS — I252 Old myocardial infarction: Secondary | ICD-10-CM | POA: Insufficient documentation

## 2013-12-04 DIAGNOSIS — Z7982 Long term (current) use of aspirin: Secondary | ICD-10-CM | POA: Insufficient documentation

## 2013-12-04 DIAGNOSIS — Z7902 Long term (current) use of antithrombotics/antiplatelets: Secondary | ICD-10-CM | POA: Insufficient documentation

## 2013-12-04 HISTORY — DX: Essential (primary) hypertension: I10

## 2013-12-04 HISTORY — DX: Acute myocardial infarction, unspecified: I21.9

## 2013-12-04 LAB — LIPASE, BLOOD: Lipase: 55 U/L (ref 11–59)

## 2013-12-04 LAB — CBC WITH DIFFERENTIAL/PLATELET
Basophils Relative: 0 % (ref 0–1)
Eosinophils Absolute: 0.2 10*3/uL (ref 0.0–0.7)
HCT: 42.3 % (ref 39.0–52.0)
Hemoglobin: 14.4 g/dL (ref 13.0–17.0)
Lymphs Abs: 1.3 10*3/uL (ref 0.7–4.0)
MCH: 31.8 pg (ref 26.0–34.0)
MCHC: 34 g/dL (ref 30.0–36.0)
Monocytes Absolute: 0.6 10*3/uL (ref 0.1–1.0)
Monocytes Relative: 8 % (ref 3–12)
Neutro Abs: 5.2 10*3/uL (ref 1.7–7.7)

## 2013-12-04 LAB — URINALYSIS, ROUTINE W REFLEX MICROSCOPIC
Glucose, UA: NEGATIVE mg/dL
Ketones, ur: NEGATIVE mg/dL
Leukocytes, UA: NEGATIVE
Nitrite: NEGATIVE
Protein, ur: NEGATIVE mg/dL
Urobilinogen, UA: 0.2 mg/dL (ref 0.0–1.0)

## 2013-12-04 LAB — COMPREHENSIVE METABOLIC PANEL
Albumin: 4 g/dL (ref 3.5–5.2)
BUN: 20 mg/dL (ref 6–23)
Chloride: 106 mEq/L (ref 96–112)
Creatinine, Ser: 1.17 mg/dL (ref 0.50–1.35)
GFR calc Af Amer: 69 mL/min — ABNORMAL LOW (ref >90)
Glucose, Bld: 109 mg/dL — ABNORMAL HIGH (ref 70–99)
Total Bilirubin: 0.5 mg/dL (ref 0.3–1.2)

## 2013-12-04 MED ORDER — IOHEXOL 300 MG/ML  SOLN
80.0000 mL | Freq: Once | INTRAMUSCULAR | Status: AC | PRN
  Administered 2013-12-04: 80 mL via INTRAVENOUS

## 2013-12-04 MED ORDER — IOHEXOL 300 MG/ML  SOLN
25.0000 mL | INTRAMUSCULAR | Status: AC
  Administered 2013-12-04: 25 mL via ORAL

## 2013-12-04 MED ORDER — OMEPRAZOLE 20 MG PO CPDR: 20.0000 mg | DELAYED_RELEASE_CAPSULE | Freq: Every day | ORAL | Status: AC

## 2013-12-04 MED ORDER — FAMOTIDINE 20 MG PO TABS: 20.0000 mg | ORAL_TABLET | Freq: Two times a day (BID) | ORAL | Status: AC

## 2013-12-04 NOTE — ED Notes (Signed)
Family at bedside. 

## 2013-12-04 NOTE — ED Provider Notes (Signed)
Presents with diffuse crampy abdominal pain onset 4 days ago. Treated with milk of magnesia with partial relief. Feels like ulcer he sat in the past. Does not feel like heart attack he's had in the past. Pain is nonradiating last bowel movement this morning, normal. This continues to feel mildly constipated. No other associated symptoms pain is mild at present on exam no distress lungs clear auscultation heart regular rate and rhythm abdomen nondistended normal active bowel sounds minimal tenderness at epigastrium no guarding rigidity or rebound genitalia normal male rectal normal tone brown stool trace Hemoccult positive  Date: 12/04/2013  Rate: 85   Rhythm: normal sinus rhythm  QRS Axis: normal  Intervals: normal  ST/T Wave abnormalities: nonspecific T wave changes  Conduction Disutrbances:none  Narrative Interpretation:   Old EKG Reviewed: No significant change over 08/07/2007 interpreted by me   Doug Sou, MD 12/04/13 1651

## 2013-12-04 NOTE — ED Notes (Signed)
Pt c/o general abdominal pain that radiates into chest area x 1 week. Pt denies n/v/d. Last BM was this morning. Pt took heartburn medication this morning.

## 2013-12-04 NOTE — ED Provider Notes (Signed)
Medical screening examination/treatment/procedure(s) were performed by non-physician practitioner and as supervising physician I was immediately available for consultation/collaboration.  EKG Interpretation   None        Doug Sou, MD 12/04/13 1651

## 2013-12-04 NOTE — ED Provider Notes (Signed)
CSN: [id number]     Arrival date & time 12/04/13  4098 History   First MD Initiated Contact with Patient 12/04/13 [phone]     Chief Complaint  Patient presents with  . Abdominal Pain   (Consider location/radiation/quality/duration/timing/severity/associated sxs/prior Treatment) Patient is a 72 y.o. male presenting with abdominal pain.  Abdominal Pain   PCP: Lorelei Pont MD  (pt home address is in [place] here visiting for the holidays)  PMH:   1. Cholecystectomy             2. Hypertension             3. MI   Pt to the ER with complaints of abdominal pain that is diffuse and has been bothering him for over a week and progressively getting worse. He has been having problems with constipation and taking stool softeners and antacids. He has had a small but normal bowel movement everyday but states he feels backed up and that his stomach is distented. He describes the pain as crampy and most tender periumbilical region. The pain waxes and wanes but is worse when he lays down. He endorse the urge to have a bowel movement.                                                    -- Declines pain medication  Past Medical History  Diagnosis Date  . Hypertension   . MI (myocardial infarction)    Past Surgical History  Procedure Laterality Date  . Open heart surgery     No family history on file. History  Substance Use Topics  . Smoking status: Former Games developer  . Smokeless tobacco: Not on file  . Alcohol Use: No    Review of Systems  Gastrointestinal: Positive for abdominal pain.   The patient denies anorexia, fever, weight loss,, vision loss, decreased hearing, hoarseness, chest pain, syncope, dyspnea on exertion, peripheral edema, balance deficits, hemoptysis, melena, hematochezia, severe indigestion/heartburn, hematuria, incontinence, genital sores, muscle weakness, suspicious skin lesions, transient blindness, difficulty walking, depression, unusual weight change, abnormal bleeding,  enlarged lymph nodes, angioedema, and breast masses.  Allergies  Review of patient's allergies indicates no known allergies.  Home Medications   Current Outpatient Rx  Name  Route  Sig  Dispense  Refill  . acetaminophen (TYLENOL) 650 MG CR tablet   Oral   Take 650 mg by mouth every 8 (eight) hours as needed for pain.         Marland Kitchen amLODipine (NORVASC) 5 MG tablet   Oral   Take 5 mg by mouth daily.         Marland Kitchen aspirin EC 81 MG tablet   Oral   Take 81 mg by mouth daily.         . baclofen (LIORESAL) 10 MG tablet   Oral   Take 10 mg by mouth 4 (four) times daily - after meals and at bedtime.         . clopidogrel (PLAVIX) 75 MG tablet   Oral   Take 75 mg by mouth daily.         . enalapril (VASOTEC) 10 MG tablet   Oral   Take 10 mg by mouth daily.         . isosorbide dinitrate (ISORDIL) 30 MG tablet   Oral   Take 30  mg by mouth 2 (two) times daily.         Marland Kitchen losartan (COZAAR) 100 MG tablet   Oral   Take 100 mg by mouth daily.         Marland Kitchen lovastatin (MEVACOR) 40 MG tablet   Oral   Take 40 mg by mouth at bedtime.         . nitroGLYCERIN (NITROSTAT) 0.4 MG SL tablet   Sublingual   Place 0.4 mg under the tongue every 5 (five) minutes as needed for chest pain.         . potassium chloride SA (K-DUR,KLOR-CON) 20 MEQ tablet   Oral   Take 20 mEq by mouth daily.          BP 130/67  Pulse 69  Temp(Src) 97.9 F (36.6 C) (Oral)  Resp 20  Wt 182 lb (82.555 kg)  SpO2 95% Physical Exam  Nursing note and vitals reviewed. Constitutional: He appears well-developed and well-nourished. No distress.  HENT:  Head: Normocephalic and atraumatic.  Eyes: Pupils are equal, round, and reactive to light.  Neck: Normal range of motion. Neck supple.  Cardiovascular: Normal rate and regular rhythm.   Pulmonary/Chest: Effort normal.  Abdominal: Soft. He exhibits no distension. There is tenderness (diffuse but mild). There is no rebound and no guarding.  Neurological:  He is alert.  Skin: Skin is warm and dry.    ED Course  Procedures (including critical care time) Labs Review Labs Reviewed  CBC WITH DIFFERENTIAL - Abnormal; Notable for the following:    Platelets 149 (*)    All other components within normal limits  COMPREHENSIVE METABOLIC PANEL - Abnormal; Notable for the following:    Glucose, Bld 109 (*)    GFR calc non Af Amer 60 (*)    GFR calc Af Amer 69 (*)    All other components within normal limits  OCCULT BLOOD, POC DEVICE - Abnormal; Notable for the following:    Fecal Occult Bld POSITIVE (*)    All other components within normal limits  TROPONIN I  URINALYSIS, ROUTINE W REFLEX MICROSCOPIC  LIPASE, BLOOD   Imaging Review Ct Abdomen Pelvis W Contrast  12/04/2013   CLINICAL DATA:  72 year old male with abdominal pain cramping, indigestion, pelvic pain, nausea, belching. Initial encounter.  EXAM: CT ABDOMEN AND PELVIS WITH CONTRAST  TECHNIQUE: Multidetector CT imaging of the abdomen and pelvis was performed using the standard protocol following bolus administration of intravenous contrast.  CONTRAST:  80mL OMNIPAQUE IOHEXOL 300 MG/ML  SOLN  COMPARISON:  [facility] chest CTA 10/06/2011 and earlier.  FINDINGS: Larger lung volumes. Decreased dependent scarring or atelectasis at the lung bases. No pericardial or pleural effusion. No lung base consolidation.  Sequelae of median sternotomy. Coronary atherosclerosis. Degenerative and postoperative changes in the lumbar spine. Mild to moderate multifactorial spinal stenosis at L4-L5. No acute osseous abnormality identified.  No pelvic free fluid. The rectum is decompressed. The bladder is diminutive and within normal limits. The sigmoid colon is decompressed. The left colon is decompressed. No definite distal colon inflammation. Negative transverse colon. Oral contrast has reached the hepatic flexure. Right colon and appendix are within normal limits. No dilated or inflamed small bowel.  Decompressed stomach and duodenum.  Unchanged subcentimeter hypodense area in the right liver dome compatible with benign cyst or biliary hamartoma. Otherwise negative liver. Gallbladder surgically absent. Negative spleen, pancreas and adrenal glands. Chronic right midpole lobulated low-density lesion but with densitometry most compatible with a simple cyst.  This measures up to 5 cm. Chronic decreased size of the left kidney compatible with mild atrophy. Simple appearing left lower pole renal cyst and other small or bilateral low-density renal lesions.  Main portal vein appears patent. There is aortoiliac soft and calcified atherosclerosis. No abdominal free fluid. No pneumoperitoneum. No focal inflammation.  IMPRESSION: No acute or inflammatory process in the abdomen or pelvis.  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   Electronically Signed   By: Augusto Gamble M.D.   On: 12/04/2013 12:22    EKG Interpretation   None       MDM   1. Peptic ulcer     Dr. Ethelda Chick did a rectal exam, trace amount of brown stool found without impaction. Hemoccult positive. I dsicussed these results with patient and he voiced his understanding of this. Will find gastro doc in [place], still referred to GI here in [place]. He states he feels much better after fluids. Declined pain medication during his stay.  His lab work and CT scan of the abdomen is otherwise reassuring.   Will start on PPI and H2  72 y.o.Givanni W Paske's evaluation in the Emergency Department is complete. It has been determined that no acute conditions requiring further emergency intervention are present at this time. The patient/guardian have been advised of the diagnosis and plan. We have discussed signs and symptoms that warrant return to the ED, such as changes or worsening in symptoms.  Vital signs are stable at discharge. Filed Vitals:   12/04/13 1230  BP:   Pulse: 69  Temp:   Resp: 20     Patient/guardian has voiced understanding and agreed to follow-up with the PCP or specialist.     Dorthula Matas, PA-C 12/04/13 1305

## 2021-08-06 ENCOUNTER — Observation Stay (HOSPITAL_COMMUNITY)
Admission: EM | Admit: 2021-08-06 | Discharge: 2021-08-11 | Disposition: A | Discharge status: Home / Self Care | Payer: Medicare Other | Attending: Emergency Medicine | Admitting: Emergency Medicine

## 2021-08-06 ENCOUNTER — Emergency Department (HOSPITAL_COMMUNITY): Payer: Medicare Other

## 2021-08-06 ENCOUNTER — Encounter (HOSPITAL_COMMUNITY): Payer: Self-pay | Admitting: Emergency Medicine

## 2021-08-06 DIAGNOSIS — R410 Disorientation, unspecified: Secondary | ICD-10-CM | POA: Diagnosis present

## 2021-08-06 DIAGNOSIS — Z79899 Other long term (current) drug therapy: Secondary | ICD-10-CM | POA: Diagnosis not present

## 2021-08-06 DIAGNOSIS — R109 Unspecified abdominal pain: Secondary | ICD-10-CM | POA: Insufficient documentation

## 2021-08-06 DIAGNOSIS — R4182 Altered mental status, unspecified: Secondary | ICD-10-CM | POA: Diagnosis not present

## 2021-08-06 DIAGNOSIS — I2510 Atherosclerotic heart disease of native coronary artery without angina pectoris: Secondary | ICD-10-CM | POA: Insufficient documentation

## 2021-08-06 DIAGNOSIS — R634 Abnormal weight loss: Secondary | ICD-10-CM | POA: Diagnosis present

## 2021-08-06 DIAGNOSIS — R296 Repeated falls: Secondary | ICD-10-CM | POA: Diagnosis not present

## 2021-08-06 DIAGNOSIS — N400 Enlarged prostate without lower urinary tract symptoms: Secondary | ICD-10-CM

## 2021-08-06 DIAGNOSIS — F0390 Unspecified dementia without behavioral disturbance: Secondary | ICD-10-CM | POA: Diagnosis not present

## 2021-08-06 DIAGNOSIS — I10 Essential (primary) hypertension: Secondary | ICD-10-CM | POA: Insufficient documentation

## 2021-08-06 DIAGNOSIS — N179 Acute kidney failure, unspecified: Secondary | ICD-10-CM | POA: Insufficient documentation

## 2021-08-06 DIAGNOSIS — W19XXXA Unspecified fall, initial encounter: Secondary | ICD-10-CM

## 2021-08-06 DIAGNOSIS — Z7902 Long term (current) use of antithrombotics/antiplatelets: Secondary | ICD-10-CM | POA: Insufficient documentation

## 2021-08-06 DIAGNOSIS — Z87891 Personal history of nicotine dependence: Secondary | ICD-10-CM | POA: Diagnosis not present

## 2021-08-06 DIAGNOSIS — Z7982 Long term (current) use of aspirin: Secondary | ICD-10-CM | POA: Insufficient documentation

## 2021-08-06 DIAGNOSIS — Z20822 Contact with and (suspected) exposure to covid-19: Secondary | ICD-10-CM | POA: Insufficient documentation

## 2021-08-06 DIAGNOSIS — E440 Moderate protein-calorie malnutrition: Secondary | ICD-10-CM | POA: Insufficient documentation

## 2021-08-06 DIAGNOSIS — E538 Deficiency of other specified B group vitamins: Secondary | ICD-10-CM | POA: Diagnosis present

## 2021-08-06 DIAGNOSIS — I700 Atherosclerosis of aorta: Secondary | ICD-10-CM | POA: Diagnosis present

## 2021-08-06 LAB — CBC WITH DIFFERENTIAL/PLATELET
Abs Immature Granulocytes: 0.1 10*3/uL — ABNORMAL HIGH (ref 0.00–0.07)
Basophils Absolute: 0 10*3/uL (ref 0.0–0.1)
Basophils Relative: 0 %
Eosinophils Absolute: 0.1 10*3/uL (ref 0.0–0.5)
Eosinophils Relative: 1 %
HCT: 45.4 % (ref 39.0–52.0)
Hemoglobin: 15.2 g/dL (ref 13.0–17.0)
Immature Granulocytes: 1 %
Lymphocytes Relative: 6 %
Lymphs Abs: 0.8 10*3/uL (ref 0.7–4.0)
MCH: 32.8 pg (ref 26.0–34.0)
MCHC: 33.5 g/dL (ref 30.0–36.0)
MCV: 97.8 fL (ref 80.0–100.0)
Monocytes Absolute: 1.1 10*3/uL — ABNORMAL HIGH (ref 0.1–1.0)
Monocytes Relative: 8 %
Neutro Abs: 11.7 10*3/uL — ABNORMAL HIGH (ref 1.7–7.7)
Neutrophils Relative %: 84 %
Platelets: 132 10*3/uL — ABNORMAL LOW (ref 150–400)
RBC: 4.64 MIL/uL (ref 4.22–5.81)
RDW: 15.7 % — ABNORMAL HIGH (ref 11.5–15.5)
WBC: 13.8 10*3/uL — ABNORMAL HIGH (ref 4.0–10.5)
nRBC: 0 % (ref 0.0–0.2)

## 2021-08-06 LAB — COMPREHENSIVE METABOLIC PANEL
ALT: 46 U/L — ABNORMAL HIGH (ref 0–44)
AST: 74 U/L — ABNORMAL HIGH (ref 15–41)
Albumin: 3.2 g/dL — ABNORMAL LOW (ref 3.5–5.0)
Alkaline Phosphatase: 71 U/L (ref 38–126)
Anion gap: 8 (ref 5–15)
BUN: 25 mg/dL — ABNORMAL HIGH (ref 8–23)
CO2: 23 mmol/L (ref 22–32)
Calcium: 8.8 mg/dL — ABNORMAL LOW (ref 8.9–10.3)
Chloride: 110 mmol/L (ref 98–111)
Creatinine, Ser: 1.34 mg/dL — ABNORMAL HIGH (ref 0.61–1.24)
GFR, Estimated: 53 mL/min — ABNORMAL LOW (ref >60)
Glucose, Bld: 143 mg/dL — ABNORMAL HIGH (ref 70–99)
Potassium: 4.1 mmol/L (ref 3.5–5.1)
Sodium: 141 mmol/L (ref 135–145)
Total Bilirubin: 1.4 mg/dL — ABNORMAL HIGH (ref 0.3–1.2)
Total Protein: 6.5 g/dL (ref 6.5–8.1)

## 2021-08-06 LAB — URINALYSIS, ROUTINE W REFLEX MICROSCOPIC
Bacteria, UA: NONE SEEN
Bilirubin Urine: NEGATIVE
Glucose, UA: NEGATIVE mg/dL
Ketones, ur: 5 mg/dL — AB
Leukocytes,Ua: NEGATIVE
Nitrite: NEGATIVE
Protein, ur: 30 mg/dL — AB
Specific Gravity, Urine: 1.024 (ref 1.005–1.030)
pH: 5 (ref 5.0–8.0)

## 2021-08-06 LAB — CBG MONITORING, ED
Glucose-Capillary: 149 mg/dL — ABNORMAL HIGH (ref 70–99)
Glucose-Capillary: 90 mg/dL (ref 70–99)

## 2021-08-06 LAB — PROTIME-INR
INR: 1.1 (ref 0.8–1.2)
Prothrombin Time: 13.9 seconds (ref 11.4–15.2)

## 2021-08-06 LAB — AMMONIA: Ammonia: 29 umol/L (ref 9–35)

## 2021-08-06 LAB — CK: Total CK: 3202 U/L — ABNORMAL HIGH (ref 49–397)

## 2021-08-06 MED ORDER — ONDANSETRON HCL 4 MG PO TABS: 4.0000 mg | ORAL_TABLET | Freq: Four times a day (QID) | ORAL | Status: DC | PRN

## 2021-08-06 MED ORDER — SENNOSIDES-DOCUSATE SODIUM 8.6-50 MG PO TABS: 1.0000 | ORAL_TABLET | Freq: Every evening | ORAL | Status: DC | PRN

## 2021-08-06 MED ORDER — ACETAMINOPHEN 325 MG PO TABS
650.0000 mg | ORAL_TABLET | Freq: Four times a day (QID) | ORAL | Status: DC | PRN
  Administered 2021-08-07: 650 mg via ORAL
  Filled 2021-08-06 (×2): qty 2

## 2021-08-06 MED ORDER — LACTATED RINGERS IV BOLUS
1000.0000 mL | Freq: Once | INTRAVENOUS | Status: AC
  Administered 2021-08-06: 1000 mL via INTRAVENOUS

## 2021-08-06 MED ORDER — LACTATED RINGERS IV SOLN: INTRAVENOUS | Status: DC

## 2021-08-06 MED ORDER — ACETAMINOPHEN 650 MG RE SUPP: 650.0000 mg | Freq: Four times a day (QID) | RECTAL | Status: DC | PRN

## 2021-08-06 MED ORDER — ENOXAPARIN SODIUM 40 MG/0.4ML IJ SOSY
40.0000 mg | PREFILLED_SYRINGE | INTRAMUSCULAR | Status: DC
  Administered 2021-08-06 – 2021-08-10 (×5): 40 mg via SUBCUTANEOUS
  Filled 2021-08-06 (×5): qty 0.4

## 2021-08-06 MED ORDER — ONDANSETRON HCL 4 MG/2ML IJ SOLN: 4.0000 mg | Freq: Four times a day (QID) | INTRAMUSCULAR | Status: DC | PRN

## 2021-08-06 NOTE — ED Provider Notes (Addendum)
Emergency Medicine Provider Triage Evaluation Note  [name] , a 80 y.o. male  was evaluated in triage.   Patient brought in by daughter today for altered mental status.  Patient with increasing generalized weakness and falls over the past 2-weeks.  Additionally with weight loss.  Recently seen by a primary care provider for possible UTI but does not appear to have started antibiotics at this point.  Daughter reports that they went to check on him today around 1 PM and found him slumped up against the toilet in the wall.  It was unclear how long patient was there.  On evaluation patient is alert to self and place only.  Confused to time and event.  Additionally patient reports that his daughter at bedside is his sister.  Daughter reports that the confusion is new for him.  Patient reports being sore all over denies any specific area of pain.  Review of Systems  Positive: Confusion, generalized weakness, fall Negative: Fever, chills, chest pain, abdominal pain.  Physical Exam  BP 121/69 (BP Location: Right Arm)   Pulse 64   Temp 99 F (37.2 C) (Oral)   Resp 16   SpO2 100%  Gen:   Awake, no distress   Resp:  Normal effort  MSK:   No TTP of the major joints.  Other:  Abdomen soft nontender.  Medical Decision Making  Medically screening exam initiated at 3:40 PM.  Appropriate orders placed.  [name] was informed that the remainder of the evaluation will be completed by another provider, this initial triage assessment does not replace that evaluation, and the importance of remaining in the ED until their evaluation is complete.  Patient will need room in the main ED for full evaluation.  Cervical collar applied by staff.  Note: Portions of this report may have been transcribed using voice recognition software. Every effort was made to ensure accuracy; however, inadvertent computerized transcription errors may still be present.       Elizabeth Palau 08/06/21  1544    Terald Sleeper, MD 08/07/21 [phone]

## 2021-08-06 NOTE — H&P (Addendum)
Date: 08/07/2021               Patient Name:  [name] MRN: [id number]  DOB: September 18, 1941 Age / Sex: 80 y.o., male   PCP: Lorelei Pont, DO         Medical Service: Internal Medicine Teaching Service         Attending Physician: Dr. Gust Rung, DO    First Contact: Dr. Welton Flakes Pager: [phone]  Second Contact: Dr. Ephriam Knuckles Pager: [phone]       After Hours (After 5p/  First Contact Pager: [phone]  weekends / holidays): Second Contact Pager: [phone]   Chief Complaint: AMS, weakness  History of Present Illness: [name] is a 80 y.o. male with past medical history of HTN, prior MI, GERD, and BPH who presents to the ED with generalized weakness, worsening confusion, and frequent falls. History was mainly provided by daughter and to a lesser extent by the patient.  Patient's daughter reports that the patient has been worsening overall in terms of having more frequent falls and behavioral changes over the past several months to a year. Patient was falling about once a month but for the last few months, he has been falling weekly. Daughter states patient's legs have become weak and he has had loss of balance.  She also reports that he has been dragging his right foot for about a week. For the last 6 months, patient has had hallucinations of seeing women in the house telling him to leave the house. The hallucination has worsened recently with patient seeing dogs at the house last Friday.   The daughter states that, around 1 PM today, the patient was found wedged between the wall and toilet seat with his neck turned to the side.  She is unsure how long he was down.  She does believe that he hit his head when he fell.  Per daughter, patient was not able to walk, and his wife with dementia was not able to help him up.  Last Friday, the patient sustained a fall and was taken to [place], [place] ED but was subsequently discharged home to follow up with PCP due patient volume.  Daughter states that he also fell yesterday. He has been having urinary and bowel incontinence as well. She also reports that he has had an unintentional weight loss of about 40 pounds in the past month. Family has noticed that the patient has had some night sweats the last few days.  He endorses chronic back, hip pain, and sharp headache, but no blurry vision. Patient endorses some RUQ pain which he describes as sharp, and his daughter states that he complains of this pain "all the time, for the past few months." No sick contacts. No fevers, SOB. He has had contracted fingers since 5 years ago.    Meds:  Current Meds  Medication Sig   aspirin EC 81 MG tablet Take 81 mg by mouth daily.   isosorbide mononitrate (IMDUR) 30 MG 24 hr tablet Take 30 mg by mouth daily.   losartan (COZAAR) 100 MG tablet Take 100 mg by mouth daily.   predniSONE (DELTASONE) 20 MG tablet Take 40 mg by mouth daily.    Allergies: Allergies as of 08/06/2021   (No Known Allergies)   Past Medical History:  Diagnosis Date   Hypertension    MI (myocardial infarction) [facility])    Surgical Hx: Quadruple bypass in 2009, s/p back surgery x6  Family History: Hx of  heart disease in brother (quadruple bypass), leukemia in brother and stomach cancer in sister, mother, and his son.  Social History: Lives with his wife who has dementia. Nephews live with them sometimes. Daughters check on them and help with ADLs. At baseline, he ambulates with a walker. Quit tobacco use in 2009 after smoking 1-2 ppd since he was 80 years old. Drinks alcohol occassionally. No illicit drug use.   Review of Systems: A complete ROS was negative except as per HPI.   Physical Exam: Blood pressure 134/71, pulse 77, temperature 99 F (37.2 C), temperature source Oral, resp. rate 16, SpO2 94 %. Physical Exam Constitutional:      General: He is not in acute distress.    Appearance: He is not ill-appearing.  HENT:     Head: Normocephalic and atraumatic.      Mouth/Throat:     Mouth: Mucous membranes are dry.     Comments: Scattered white patches in mouth. Eyes:     Conjunctiva/sclera: Conjunctivae normal.     Pupils: Pupils are equal, round, and reactive to light.  Neck:     Comments: C-collar intact Cardiovascular:     Rate and Rhythm: Normal rate and regular rhythm.     Heart sounds: No murmur heard.   No friction rub. No gallop.     Comments: Able to palpate DP pulse on the right, unable to palpate on the left Pulmonary:     Effort: Pulmonary effort is normal.     Breath sounds: Normal breath sounds.  Abdominal:     General: Bowel sounds are normal.     Palpations: Abdomen is soft.     Tenderness: There is abdominal tenderness. There is no guarding or rebound.     Comments: Tenderness to palpation of RUQ > LUQ, suprapubic areas  Musculoskeletal:        General: No swelling, deformity or signs of injury.  Skin:    General: Skin is warm and dry.  Neurological:     Mental Status: He is alert.     Comments: Patient is oriented to situation, able to answer questions appropriately. Difficult to understand at times, which is not baseline for him per daughter. Cranial Nerves:             II: PERRL             V, VII: Face symmetric, sensation intact in all 3 divisions               IX, X: palate rises symmetrically             XI: Shoulder shrug normal bilaterally               XII: Tongue midline    Sensation: Light touch intact and symmetric in bilateral UE and LE.  Strength: 5/5 in bilateral UE. 3/5 in RLE which is not his baseline per daughter. 5/5 in LLE. Psychiatric: Normal mood and affect     EKG: personally reviewed my interpretation is Sinus rhythm with PACs  CXR: personally reviewed my interpretation is elevated R hemidiaphragm with minimal right basilar subsegmental atelectasis.  Bilateral hip Xray: No acute abnormalities. 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  CT head: Mild right parieto-occipital  scalp soft tissue swelling without evidence of acute fracture or acute intracranial abnormality.  CT C-spine: No evidence of an acute fracture or subluxation.  Assessment & Plan by Problem: [name] is a 80 y.o. male with a  PMHx of HTN, prior MI, GERD, and BPH with progressively worsening AMS and frequent falls, who is being admitted for medical management and work-up of his AMS, rhabdomyolysis and AKI.  Active Problems:   Altered mental status, unspecified  #Altered mental status with frequent falls #Unintentional weight loss Patient has been having more frequent falls behavioral changes over the past year, and this has especially worsened over the past 1-2 weeks per the patient's daughter. This morning, he sustained a fall in the bathroom and hit his head.  He was down for an unknown period of time. He also has a history of unintentional weight loss and has had night sweats for the past few days. He has been hemodynamically stable since arrival.  Patient was difficult to understand at times, which the daughter states is unusual for him.  Otherwise on exam, he is frail-appearing, has dry mucous membranes, and has RLE weakness which is also new per daughter.  Labs are significant for Glu 149, WBC 13.8, electrolytes WNL, BUN 25, creatinine 1.34 (baseline around 1.1-1.2 per chart review), AST 74, ALT 46. CK 3202. UA showed some Hgb, negative for RBCs, leukocytes, WBC, bacteria.  There is no evidence of trauma on imaging so far.  Differential diagnosis includes dementia versus stroke versus malignancy. Dementia is likely given the gradual worsening of his behavioral symptoms and falls. Stroke is also on the differential given his new RLE weakness; CT head noncontrast is negative but MRI brain is pending. Malignancy is also a possibility given his unintentional weight loss of 40 lbs over the past month, strong family history of cancer, and history of tobacco use.   -MRI brain WO contrast  pending -CT abdomen/pelvis pending -TSH pending -Vitamin B12 pending -Trend CBC, BMP  #Elevated CK Likely from patient being down in the bathroom for an unknown period of time after sustaining a fall. UA showed some hemoglobin but no RBCs. CK 3202.  -IV LR 1L bolus today -IV LR at 100 mL/hr -Trend CK  #AKI BUN 25, creatinine 1.34 (baseline ~1.1-1.2). Likely multifactorial due to dehydration, rhabdo. Patient also has a history of BPH and is on tamsulosin at home. -Fluid resuscitation as above -Renal ultrasound pending  #Abdominal pain #Transaminases -CT abdomen pelvis WO contrast pending to eval for possible hepatic etiology  #HTN BP systolic ranging 264B-583E. Patient is on amlodipine 10 mg p.o. daily, losartan 100 mg p.o. daily, Imdur 30 mg p.o. daily. -Continue amlodipine 10 mg p.o. daily -Can resume the rest of his meds following treatment of AKI  #CAD Patient denies chest pain or shortness of breath at this time. EKG shows no ischemic changes. -Continue ASA 81 mg -Nitroglycerin as needed  #BPH -Continue tamsulosin 0.4 mg p.o. twice daily  #GERD -Continue Protonix 40 mg p.o. daily  Dispo: Admit patient to Observation with expected length of stay less than 2 midnights.  Signed: Andrey Campanile, MD 08/07/2021, 1:48 AM  Pager: [phone]  After 5pm on weekdays and 1pm on weekends: On Call pager: [phone]

## 2021-08-06 NOTE — ED Triage Notes (Signed)
Pt here from home with c/o gen weakness and falls over the last 2 weeks and wt loss , just recently seen by a neuro MD for dementia, ?UTI

## 2021-08-06 NOTE — ED Provider Notes (Signed)
[facility] EMERGENCY DEPARTMENT Provider Note   CSN: [id number] Arrival date & time: 08/06/21  1437     History No chief complaint on file.   [name] is a 80 y.o. male.  HPI  80 year old male with past medical history of hypertension and previous MI presenting to the emergency department with generalized weakness and worsening confusion in the setting of a fall.  Daughter reports that the patient has struggled for the past several weeks to months with increasing confusion.  He has been hallucinating strange woman in the house as well as small animals that are not there.  They have been hiring home health and having family ever stay with him, but he always becomes irate and chases them off.  They state that they checked on him last night and he was in his normal state health.  This morning around 1 PM, they found the patient in the bathroom.  He seemed to have fallen off the toilet and become lodged between the toilet and the wall.  They state that he has had multiple falls recently but refused to come to the emergency department.  Currently in the emergency department, the patient states that he has no complaints and wishes to be left alone.  Past Medical History:  Diagnosis Date   Hypertension    MI (myocardial infarction) (HCC)     There are no problems to display for this patient.   Past Surgical History:  Procedure Laterality Date   open heart surgery         No family history on file.  Social History   Tobacco Use   Smoking status: Former  Substance Use Topics   Alcohol use: No   Drug use: No    Home Medications Prior to Admission medications   Medication Sig Start Date End Date Taking? Authorizing Provider  acetaminophen (TYLENOL) 650 MG CR tablet Take 650 mg by mouth every 8 (eight) hours as needed for pain.    [provider]  amLODipine (NORVASC) 5 MG tablet Take 5 mg by mouth daily. 11/14/13   [provider]   aspirin EC 81 MG tablet Take 81 mg by mouth daily.    [provider]  baclofen (LIORESAL) 10 MG tablet Take 10 mg by mouth 4 (four) times daily - after meals and at bedtime. 11/23/13   [provider]  clopidogrel (PLAVIX) 75 MG tablet Take 75 mg by mouth daily. 10/02/13   [provider]  enalapril (VASOTEC) 10 MG tablet Take 10 mg by mouth daily. 10/02/13   [provider]  famotidine (PEPCID) 20 MG tablet Take 1 tablet (20 mg total) by mouth 2 (two) times daily. 12/04/13   Marlon Pel, PA-C  isosorbide dinitrate (ISORDIL) 30 MG tablet Take 30 mg by mouth 2 (two) times daily. 11/14/13   [provider]  losartan (COZAAR) 100 MG tablet Take 100 mg by mouth daily. 11/15/13   [provider]  lovastatin (MEVACOR) 40 MG tablet Take 40 mg by mouth at bedtime.    [provider]  nitroGLYCERIN (NITROSTAT) 0.4 MG SL tablet Place 0.4 mg under the tongue every 5 (five) minutes as needed for chest pain.    [provider]  omeprazole (PRILOSEC) 20 MG capsule Take 1 capsule (20 mg total) by mouth daily. 12/04/13   Marlon Pel, PA-C  potassium chloride SA (K-DUR,KLOR-CON) 20 MEQ tablet Take 20 mEq by mouth daily.    [provider]  Allergies    Patient has no known allergies.  Review of Systems   Review of Systems  Constitutional:  Positive for fatigue. Negative for chills and fever.  HENT:  Negative for ear pain and sore throat.   Eyes:  Negative for pain and visual disturbance.  Respiratory:  Negative for cough and shortness of breath.   Cardiovascular:  Negative for chest pain and palpitations.  Gastrointestinal:  Negative for abdominal pain and vomiting.  Genitourinary:  Negative for dysuria and hematuria.  Musculoskeletal:  Negative for arthralgias and back pain.  Skin:  Negative for color change and rash.  Neurological:  Negative for seizures and syncope.  All other systems reviewed and are  negative.  Physical Exam Updated Vital Signs BP 122/77 (BP Location: Left Arm)   Pulse 86   Temp 99 F (37.2 C) (Oral)   Resp (!) 23   SpO2 94%   Physical Exam Vitals and nursing note reviewed.  Constitutional:      General: He is not in acute distress.    Appearance: He is well-developed. He is ill-appearing. He is not toxic-appearing.     Comments: Thin  HENT:     Head: Normocephalic.     Comments: Small right frontal contusion    Nose: Nose normal. No congestion or rhinorrhea.     Mouth/Throat:     Mouth: Mucous membranes are dry.     Pharynx: No posterior oropharyngeal erythema.     Comments: Small white plaques to the left buccal surface Eyes:     Conjunctiva/sclera: Conjunctivae normal.  Cardiovascular:     Rate and Rhythm: Normal rate and regular rhythm.     Heart sounds: No murmur heard. Pulmonary:     Effort: Pulmonary effort is normal. No respiratory distress.     Breath sounds: Normal breath sounds.  Abdominal:     Palpations: Abdomen is soft.     Tenderness: There is no abdominal tenderness. There is no guarding or rebound.  Musculoskeletal:        General: No deformity.     Cervical back: Normal range of motion and neck supple. No rigidity or tenderness.  Skin:    General: Skin is warm and dry.     Capillary Refill: Capillary refill takes less than 2 seconds.  Neurological:     Mental Status: He is alert. Mental status is at baseline. He is disoriented.    ED Results / Procedures / Treatments   Labs (all labs ordered are listed, but only abnormal results are displayed) Labs Reviewed  CBC WITH DIFFERENTIAL/PLATELET - Abnormal; Notable for the following components:      Result Value   WBC 13.8 (*)    RDW 15.7 (*)    Platelets 132 (*)    Neutro Abs 11.7 (*)    Monocytes Absolute 1.1 (*)    Abs Immature Granulocytes 0.10 (*)    All other components within normal limits  COMPREHENSIVE METABOLIC PANEL - Abnormal; Notable for the following components:    Glucose, Bld 143 (*)    BUN 25 (*)    Creatinine, Ser 1.34 (*)    Calcium 8.8 (*)    Albumin 3.2 (*)    AST 74 (*)    ALT 46 (*)    Total Bilirubin 1.4 (*)    GFR, Estimated 53 (*)    All other components within normal limits  CK - Abnormal; Notable for the following components:   Total CK 3,202 (*)    All other  components within normal limits  CBG MONITORING, ED - Abnormal; Notable for the following components:   Glucose-Capillary 149 (*)    All other components within normal limits  AMMONIA  PROTIME-INR  URINALYSIS, ROUTINE W REFLEX MICROSCOPIC  CBG MONITORING, ED    EKG None  Radiology DG Chest 1 View  Result Date: 08/06/2021 CLINICAL DATA:  Altered mental status. EXAM: CHEST  1 VIEW COMPARISON:  July 07, 2021. FINDINGS: The heart size and mediastinal contours are within normal limits. Left lung is clear. Elevated right hemidiaphragm is noted with minimal right basilar subsegmental atelectasis. Status post coronary bypass graft. The visualized skeletal structures are unremarkable. IMPRESSION: Elevated right hemidiaphragm with minimal right basilar subsegmental atelectasis. Electronically Signed   By: Lupita Raider M.D.   On: 08/06/2021 16:39   CT Head Wo Contrast  Result Date: 08/06/2021 CLINICAL DATA:  Weakness and falls x2 weeks. EXAM: CT HEAD WITHOUT CONTRAST TECHNIQUE: Contiguous axial images were obtained from the base of the skull through the vertex without intravenous contrast. COMPARISON:  None. FINDINGS: Brain: There is mild cerebral atrophy with widening of the extra-axial spaces and ventricular dilatation. There are areas of decreased attenuation within the white matter tracts of the supratentorial brain, consistent with microvascular disease changes. Vascular: No hyperdense vessel or unexpected calcification. Skull: Normal. Negative for fracture or focal lesion. Sinuses/Orbits: No acute finding. Other: Mild right parieto-occipital scalp soft tissue swelling is  seen. IMPRESSION: Mild right parieto-occipital scalp soft tissue swelling without evidence of acute fracture or acute intracranial abnormality. Electronically Signed   By: Aram Candela M.D.   On: 08/06/2021 17:55   CT Cervical Spine Wo Contrast  Result Date: 08/06/2021 CLINICAL DATA:  Multiple recent falls. EXAM: CT CERVICAL SPINE WITHOUT CONTRAST TECHNIQUE: Multidetector CT imaging of the cervical spine was performed without intravenous contrast. Multiplanar CT image reconstructions were also generated. COMPARISON:  None. FINDINGS: Alignment: Normal. Skull base and vertebrae: No acute fracture. Chronic and degenerative changes seen along the body and tip of the dens. Postoperative changes are seen involving the posterior elements from the level of C3 through C7. Soft tissues and spinal canal: No prevertebral fluid or swelling. No visible canal hematoma. Disc levels: Moderate severity multilevel endplate sclerosis and marked severity anterior osteophyte formation is seen at the levels of C2-C3, C3-C4, C4-C5, C5-C6 and C6-C7. There is marked severity narrowing of the anterior atlantoaxial articulation. Marked severity intervertebral disc space narrowing is seen at the level of C5-C6 with moderate severity multilevel intervertebral disc space narrowing noted throughout the remainder of the cervical spine. Marked severity bilateral multilevel facet joint hypertrophy is noted. Upper chest: Negative. Other: None. IMPRESSION: 1. Extensive chronic, degenerative and postoperative changes, as described above. 2. No evidence of an acute fracture or subluxation. Electronically Signed   By: Aram Candela M.D.   On: 08/06/2021 18:02   DG HIP UNILAT WITH PELVIS 2-3 VIEWS LEFT  Result Date: 08/06/2021 CLINICAL DATA:  Falls. EXAM: DG HIP (WITH OR WITHOUT PELVIS) 2-3V LEFT COMPARISON:  May 07, 2020. FINDINGS: There is no evidence of hip fracture or dislocation. No significant joint space narrowing is noted. Mild  osteophyte formation is seen involving the left hip. 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 No acute abnormality seen. Electronically Signed   By: Lupita Raider M.D.   On: 08/06/2021 16:43   DG HIP UNILAT WITH PELVIS 2-3 VIEWS RIGHT  Result Date: 08/06/2021 CLINICAL DATA:  Falls. EXAM: DG HIP (WITH OR WITHOUT PELVIS) 2-3V  RIGHT COMPARISON:  None. FINDINGS: There is no evidence of hip fracture or dislocation. No joint space narrowing is noted. Mild osteophyte formation is noted. IMPRESSION: Mild degenerative joint disease of right hip. No acute abnormality seen. Electronically Signed   By: Lupita RaiderJames  Green Jr M.D.   On: 08/06/2021 16:38    Procedures Procedures   Medications Ordered in ED Medications  lactated ringers bolus 1,000 mL (has no administration in time range)  lactated ringers infusion (has no administration in time range)    ED Course  I have reviewed the triage vital signs and the nursing notes.  Pertinent labs & imaging results that were available during my care of the patient were reviewed by me and considered in my medical decision making (see chart for details).    MDM Rules/Calculators/A&P                           80 year old male with above past medical history presenting to the emergency department following a fall in the setting of worsening confusion.  Daughter reports a progressively worsening history of visual hallucinations, agitation, and confusion.  Patient was found down this morning, unknown downtime but last seen yesterday.  Vital signs reviewed, within acceptable limits.  On physical exam, the patient is frail-appearing and seems clinically dehydrated, but no obvious areas of traumatic injury.  CT head, CT C-spine, chest x-ray, pelvis x-ray without any acute traumatic injury.  I do not believe that CT imaging of the chest, abdomen, or pelvis is indicated.  Differential includes deconditioning, dementia, rhabdo, infection.  They report a  cough, but chest x-ray does not show any pneumonia or pneumothorax.  We will attempt to in and out cath the patient and I have ordered urine sample to evaluate for urinary tract infection.  On blood work, the patient has no leukocytosis, his metabolic panel shows a mild increase of his creatinine from 1.1-1.34.  His CK is greater than 5 times the normal limit consistent with rhabdomyolysis, likely from his prolonged downtime.  We will provide a fluid bolus and then fluid infusion.  I believe that he warrants admission for rhabdo in the setting of evidence of renal injury and given that it is clearly not safe for him to be at home with his current level of care.  Family is comfortable with this plan.  Handoff given to admitting team.  They will follow-up on the results of his urine studies.  Final Clinical Impression(s) / ED Diagnoses Final diagnoses:  Fall    Rx / DC Orders ED Discharge Orders     None        Lenard LancePowell, Aribelle Mccosh, MD 08/06/21 2045    Alvira MondaySchlossman, Erin, MD 08/07/21 [phone]

## 2021-08-06 NOTE — ED Notes (Signed)
Hospitalist team at bedside updating patient and patient family.

## 2021-08-06 NOTE — ED Notes (Signed)
C-Collar removed by EDP Schlossman

## 2021-08-07 ENCOUNTER — Observation Stay (HOSPITAL_COMMUNITY): Payer: Medicare Other

## 2021-08-07 DIAGNOSIS — I700 Atherosclerosis of aorta: Secondary | ICD-10-CM | POA: Diagnosis present

## 2021-08-07 DIAGNOSIS — R634 Abnormal weight loss: Secondary | ICD-10-CM | POA: Diagnosis present

## 2021-08-07 DIAGNOSIS — F0390 Unspecified dementia without behavioral disturbance: Secondary | ICD-10-CM | POA: Diagnosis present

## 2021-08-07 DIAGNOSIS — R4182 Altered mental status, unspecified: Principal | ICD-10-CM

## 2021-08-07 DIAGNOSIS — M6282 Rhabdomyolysis: Secondary | ICD-10-CM | POA: Diagnosis not present

## 2021-08-07 DIAGNOSIS — E538 Deficiency of other specified B group vitamins: Secondary | ICD-10-CM | POA: Diagnosis present

## 2021-08-07 LAB — VITAMIN B12: Vitamin B-12: 93 pg/mL — ABNORMAL LOW (ref 180–914)

## 2021-08-07 LAB — CBC
HCT: 38 % — ABNORMAL LOW (ref 39.0–52.0)
Hemoglobin: 12.8 g/dL — ABNORMAL LOW (ref 13.0–17.0)
MCH: 33.2 pg (ref 26.0–34.0)
MCHC: 33.7 g/dL (ref 30.0–36.0)
MCV: 98.7 fL (ref 80.0–100.0)
Platelets: UNDETERMINED 10*3/uL (ref 150–400)
RBC: 3.85 MIL/uL — ABNORMAL LOW (ref 4.22–5.81)
RDW: 15.6 % — ABNORMAL HIGH (ref 11.5–15.5)
WBC: 10.7 10*3/uL — ABNORMAL HIGH (ref 4.0–10.5)
nRBC: 0 % (ref 0.0–0.2)

## 2021-08-07 LAB — CK: Total CK: 2072 U/L — ABNORMAL HIGH (ref 49–397)

## 2021-08-07 LAB — TSH: TSH: 1.29 u[IU]/mL (ref 0.350–4.500)

## 2021-08-07 LAB — RESP PANEL BY RT-PCR (FLU A&B, COVID) ARPGX2
Influenza A by PCR: NEGATIVE
Influenza B by PCR: NEGATIVE
SARS Coronavirus 2 by RT PCR: NEGATIVE

## 2021-08-07 LAB — BASIC METABOLIC PANEL
Anion gap: 6 (ref 5–15)
BUN: 22 mg/dL (ref 8–23)
CO2: 25 mmol/L (ref 22–32)
Calcium: 8 mg/dL — ABNORMAL LOW (ref 8.9–10.3)
Chloride: 105 mmol/L (ref 98–111)
Creatinine, Ser: 1.03 mg/dL (ref 0.61–1.24)
GFR, Estimated: >60 mL/min (ref >60)
Glucose, Bld: 148 mg/dL — ABNORMAL HIGH (ref 70–99)
Potassium: 3.5 mmol/L (ref 3.5–5.1)
Sodium: 136 mmol/L (ref 135–145)

## 2021-08-07 LAB — HEMOGLOBIN A1C
Hgb A1c MFr Bld: 6.2 % — ABNORMAL HIGH (ref 4.8–5.6)
Mean Plasma Glucose: 131.24 mg/dL

## 2021-08-07 MED ORDER — ASPIRIN EC 81 MG PO TBEC
81.0000 mg | DELAYED_RELEASE_TABLET | Freq: Every day | ORAL | Status: DC
  Administered 2021-08-07 – 2021-08-10 (×4): 81 mg via ORAL
  Filled 2021-08-07 (×5): qty 1

## 2021-08-07 MED ORDER — PANTOPRAZOLE SODIUM 40 MG PO TBEC
40.0000 mg | DELAYED_RELEASE_TABLET | Freq: Every day | ORAL | Status: DC
  Administered 2021-08-07 – 2021-08-10 (×4): 40 mg via ORAL
  Filled 2021-08-07 (×5): qty 1

## 2021-08-07 MED ORDER — CYANOCOBALAMIN 1000 MCG/ML IJ SOLN
1000.0000 ug | Freq: Every day | INTRAMUSCULAR | Status: DC
  Administered 2021-08-08 – 2021-08-10 (×2): 1000 ug via INTRAMUSCULAR
  Filled 2021-08-07 (×4): qty 1

## 2021-08-07 MED ORDER — NITROGLYCERIN 0.4 MG SL SUBL: 0.4000 mg | SUBLINGUAL_TABLET | SUBLINGUAL | Status: DC | PRN

## 2021-08-07 MED ORDER — LACTATED RINGERS IV BOLUS
1000.0000 mL | Freq: Once | INTRAVENOUS | Status: AC
  Administered 2021-08-07: 1000 mL via INTRAVENOUS

## 2021-08-07 MED ORDER — LOSARTAN POTASSIUM 50 MG PO TABS
100.0000 mg | ORAL_TABLET | Freq: Every day | ORAL | Status: DC
  Administered 2021-08-07 – 2021-08-10 (×4): 100 mg via ORAL
  Filled 2021-08-07 (×5): qty 2

## 2021-08-07 MED ORDER — ADULT MULTIVITAMIN W/MINERALS CH
1.0000 | ORAL_TABLET | Freq: Every day | ORAL | Status: DC
  Administered 2021-08-07 – 2021-08-10 (×4): 1 via ORAL
  Filled 2021-08-07 (×4): qty 1

## 2021-08-07 MED ORDER — CYANOCOBALAMIN 1000 MCG/ML IJ SOLN
1000.0000 ug | Freq: Once | INTRAMUSCULAR | Status: AC
  Administered 2021-08-07: 1000 ug via INTRAMUSCULAR
  Filled 2021-08-07: qty 1

## 2021-08-07 MED ORDER — AMLODIPINE BESYLATE 10 MG PO TABS
10.0000 mg | ORAL_TABLET | Freq: Every day | ORAL | Status: DC
  Administered 2021-08-07 – 2021-08-10 (×4): 10 mg via ORAL
  Filled 2021-08-07 (×4): qty 1
  Filled 2021-08-07: qty 2

## 2021-08-07 MED ORDER — TAMSULOSIN HCL 0.4 MG PO CAPS
0.4000 mg | ORAL_CAPSULE | Freq: Two times a day (BID) | ORAL | Status: DC
  Administered 2021-08-07 – 2021-08-10 (×8): 0.4 mg via ORAL
  Filled 2021-08-07 (×9): qty 1

## 2021-08-07 NOTE — ED Notes (Signed)
Patient finished 1st contrast bottle.

## 2021-08-07 NOTE — ED Notes (Signed)
Pt alert, NAD, calm, interactive, breakfast eaten, admitting team in to see pt, mentions mack neck and leg pain, 5/10, IVF infusing, labs sent, VSS.

## 2021-08-07 NOTE — Progress Notes (Addendum)
Subjective: I seen and evaluated [name] at bedside.  He was lying comfortably in bed.  He was alert and oriented x3.  He states that he is having some weakness of the left leg; however on admission he stated that he had right leg weakness.  He recalls having a recent fall and states that he falls down due to pain in his hips and lower back.  He states that he lives with his sister who cares for him and he has friends and other family members that come by and care for him.  He is unable to perform ADLs independently.  He recently endorses taking unknown medication but cannot recall what it is or what it is for. Otherwise, he denies headache, lightheadedness, dizziness, blurry vision, chest pain, SOB, abdominal pain, or lower extremity pain.   Spoke with the daughter over the phone, she endorses that her father [name] lives at home with his wife who has dementia. She states that sometimes the neighbor and her nephew will check in on them. Otherwise, he is not being cared for around the clock. She lives 45 minutes away and will come by sometimes to drop off meals for her father and mother. She reports that her father needs assistance in bathing, using the bathroom, preparing meals and all other ADLs. She repots that he has fallen 7 times in the last month. She feels that a SNF will be best for her father. Preference for [facility] which is the closest place to where her father lives and he has been there in the past.   Objective:  Vital signs in last 24 hours: Vitals:   08/07/21 0400 08/07/21 0430 08/07/21 0618 08/07/21 0700  BP: (!) 138/98 (!) 173/80 (!) 143/70 120/67  Pulse: 70 65 68 61  Resp: 19 (!) 22 (!) 21 20  Temp:      TempSrc:      SpO2: 96% 97% 96% 96%   Physical Exam Constitutional:      General: He is awake.  HENT:     Head: Normocephalic and atraumatic.  Cardiovascular:     Rate and Rhythm: Normal rate and regular rhythm.     Heart sounds: Normal heart sounds.   Pulmonary:     Effort: Pulmonary effort is normal.     Breath sounds: Normal breath sounds.  Abdominal:     Palpations: Abdomen is soft.  Musculoskeletal:     Right lower leg: No edema.     Left lower leg: No edema.  Skin:    General: Skin is warm and dry.  Neurological:     Mental Status: He is alert and oriented to person, place, and time.  Psychiatric:        Behavior: Behavior normal. Behavior is cooperative.     Assessment/Plan:  Active Problems:   Altered mental status, unspecified   Altered mental status with frequent falls #Unintentional weight loss #Possible Dementia # Vitamin B12 deficiency TSH WNL at 1.29. Vitamin B12 low at 93, which can explain the recent increase in falls. --PT/OT eval pending --Vitamin B12 injection ordered daily x7 days, may be changed to weekly for 1 month when discharged to SNF -Trend CBC, BMP   Rhabdomyolysis CK is trending down 2072, down from 3202.  -IV LR 1L bolus given today -Trend CK   AKI Current Cr is WNL at 1.03. --IV LR continuous infusion- DC'd  Transaminitis 2/2 elevated CK, expect to normalize with CK. --Trend CMP   #HTN BP  systolic ranging 485I-627O. Patient is on amlodipine 10 mg p.o. daily, losartan 100 mg p.o. daily, Imdur 30 mg p.o. daily. -amlodipine 10 mg p.o. daily -resume losartan 100mg  daily   #CAD Patient denies chest pain or shortness of breath at this time. EKG shows no ischemic changes. -Continue ASA 81 mg -Nitroglycerin as needed  #BPH -Continue tamsulosin 0.4 mg p.o. twice daily   #GERD -Continue Protonix 40 mg p.o. daily   Prior to Admission Living Arrangement:home Anticipated Discharge Location:? SNF Barriers to Discharge:medical treatment Dispo: Anticipated discharge in approximately 2-3 day(s).   , MD 08/07/2021, 7:56 AM Pager: [phone] After 5pm on weekdays and 1pm on weekends: On Call pager [phone]

## 2021-08-07 NOTE — ED Notes (Signed)
Pt alert, NAD, calm, interactive, breakfast eaten. Speaking in clear complete sentences. Pt updated.

## 2021-08-07 NOTE — ED Notes (Signed)
Attempted to give report to floor 5M01  

## 2021-08-07 NOTE — ED Notes (Signed)
Stuck patient x3 no blood return reported to nurse woody RN

## 2021-08-07 NOTE — Evaluation (Signed)
Physical Therapy Evaluation Patient Details Name: [name] MRN: [id number] DOB: [birth year] Today's Date: 08/07/2021   History of Present Illness  The pt is an 80 yo male presenting 8/28 with AMS and repeated falls. Upon work-up pt found to have B12 deficiency, rhabdomyolysis, and AKI. PMH includes: HTN and prior MI.   Clinical Impression  Pt in bed upon arrival of PT, agreeable to evaluation at this time. The pt reports he was ambulating with use of a cane at home where he lives with his sister, but that otherwise he was independent and able to complete IADLs such as driving and errands. No family was present to convey pt's actual prior level of function at time of eval. The pt now presents with limitations in functional mobility, strength, power, stability (seated and standing), and activity tolerance due to above dx, and will continue to benefit from skilled PT to address these deficits. The pt was able to complete bed mobility with minA, but then required modA to maintain static sitting balance due to strong posterior lean and decreased awareness. The pt required maxA to complete sit-stand transfers at this time due to poor functional strength and power in LE as well as decreased postural awareness to maintain balance in standing. The pt needed continued modA to manage gait for short distances in the room at this time, and will continue to benefit from skilled PT acutely to progress strength and stability to allow for improved performance with transfers and mobility, but will also require d/c to SNF for continued rehab and 24/7 supervision/assist as the pt does not have this level of assist at home, and is not currently safe with mobility or aware enough of his deficits to be safe mobilizing at home without 24/7 assist.      Follow Up Recommendations SNF;Supervision/Assistance - 24 hour    Equipment Recommendations  Other (comment) (defer to post acute)    Recommendations for Other  Services       Precautions / Restrictions Precautions Precautions: Fall Precaution Comments: pt with frequent falls last 2-3 months Restrictions Weight Bearing Restrictions: No      Mobility  Bed Mobility Overal bed mobility: Needs Assistance Bed Mobility: Supine to Sit     Supine to sit: Min assist     General bed mobility comments: pt briging LE to EOB without assist, minA to elevate trunk and modA to maintain static sitting EOB (pt with strong posterior lean)    Transfers Overall transfer level: Needs assistance Equipment used: Rolling walker (2 wheeled) Transfers: Sit to/from Stand Sit to Stand: Max assist         General transfer comment: pt stating he needs to use momentum to power up, but when given opportunity, he only leans further backwards. maxA to power up to standing, pt needing max cues for hand positioning and repositioning to RW  Ambulation/Gait Ambulation/Gait assistance: Mod assist Gait Distance (Feet): 25 Feet Assistive device: Rolling walker (2 wheeled) Gait Pattern/deviations: Step-to pattern;Shuffle;Trunk flexed;Narrow base of support;Staggering left;Drifts right/left Gait velocity: decreased Gait velocity interpretation: <1.31 ft/sec, indicative of household ambulator General Gait Details: pt drifting to L with gait, to the point of walking outside of RW to the L. max cues to reposition and to stay in RW. pt with minimal clearance and unable to tolerate minor perturbations without LOB requiring assist      Balance Overall balance assessment: Needs assistance;History of Falls Sitting-balance support: Bilateral upper extremity supported;Feet supported Sitting balance-Leahy Scale: Poor Sitting balance - Comments:  reliant on BUE and modA Postural control: Posterior lean Standing balance support: Bilateral upper extremity supported;During functional activity Standing balance-Leahy Scale: Poor Standing balance comment: reliant on BUE support on RW  and modA                             Pertinent Vitals/Pain Pain Assessment: Faces Faces Pain Scale: Hurts little more Pain Location: back, RLE Pain Descriptors / Indicators: Discomfort Pain Intervention(s): Limited activity within patient's tolerance;Monitored during session;Repositioned    Home Living Family/patient expects to be discharged to:: Skilled nursing facility                 Additional Comments: pt reports from home with his sister. reports he lives in a home with 2 steps to enter and a basement he does not need to use. pt is unreliable historian and no family present to confirm.    Prior Function Level of Independence: Needs assistance   Gait / Transfers Assistance Needed: pt reports independent with use of cane, but per chart pt with frequent falls and weakness needing assist to mobilize safely.  ADL's / Homemaking Assistance Needed: pt reports independent, per chart, family has been assisting with IADLs        Hand Dominance        Extremity/Trunk Assessment   Upper Extremity Assessment Upper Extremity Assessment: Generalized weakness    Lower Extremity Assessment Lower Extremity Assessment: Generalized weakness (RLE > LLE, pt reports decreased sensation to RLE, no instances of buckling with gait, but pt with significantly impaired power to come to standing)    Cervical / Trunk Assessment Cervical / Trunk Assessment: Kyphotic  Communication   Communication: No difficulties (soft spoken)  Cognition Arousal/Alertness: Awake/alert Behavior During Therapy: WFL for tasks assessed/performed;Flat affect Overall Cognitive Status: No family/caregiver present to determine baseline cognitive functioning Area of Impairment: Orientation;Memory;Following commands;Awareness;Problem solving                 Orientation Level: Disoriented to;Time;Place;Situation (pt unable to state location, states date as 9/28, and reports he came here because of  back pain)   Memory: Decreased short-term memory Following Commands: Follows one step commands consistently;Follows one step commands with increased time   Awareness: Intellectual Problem Solving: Slow processing;Decreased initiation;Difficulty sequencing;Requires verbal cues General Comments: pt answering questions regarding PLOF but responses not matching documented information from other providers. The pt was able to follow simple commands and cues, but demos little insight to problems or to make corrections on his own      General Comments General comments (skin integrity, edema, etc.): VSS on RA, pt soiled in urine upon arrival with no awareness    Exercises     Assessment/Plan    PT Assessment Patient needs continued PT services  PT Problem List Decreased strength;Decreased range of motion;Decreased activity tolerance;Decreased mobility;Decreased balance;Decreased coordination;Decreased cognition;Decreased safety awareness       PT Treatment Interventions DME instruction;Gait training;Stair training;Functional mobility training;Therapeutic activities;Therapeutic exercise;Balance training;Patient/family education    PT Goals (Current goals can be found in the Care Plan section)  Acute Rehab PT Goals Patient Stated Goal: to walk better PT Goal Formulation: With patient Time For Goal Achievement: 08/21/21 Potential to Achieve Goals: Good    Frequency Min 2X/week   Barriers to discharge Decreased caregiver support         AM-PAC PT "6 Clicks" Mobility  Outcome Measure Help needed turning from your back to your side while in  a flat bed without using bedrails?: A Little Help needed moving from lying on your back to sitting on the side of a flat bed without using bedrails?: A Little Help needed moving to and from a bed to a chair (including a wheelchair)?: A Lot Help needed standing up from a chair using your arms (e.g., wheelchair or bedside chair)?: A Lot Help needed to  walk in hospital room?: A Lot Help needed climbing 3-5 steps with a railing? : A Lot 6 Click Score: 14    End of Session Equipment Utilized During Treatment: Gait belt Activity Tolerance: Patient tolerated treatment well Patient left: in chair;with call bell/phone within reach;with chair alarm set Nurse Communication: Mobility status PT Visit Diagnosis: Other abnormalities of gait and mobility (R26.89);Muscle weakness (generalized) (M62.81);History of falling (Z91.81)    Time: 1710-1745 PT Time Calculation (min) (ACUTE ONLY): 35 min   Charges:   PT Evaluation $PT Eval Low Complexity: 1 Low PT Treatments $Therapeutic Activity: 8-22 mins        Vickki Muff, PT, DPT   Acute Rehabilitation Department Pager #: [phone]  Ronnie Derby 08/07/2021, 6:11 PM

## 2021-08-07 NOTE — ED Notes (Signed)
Report given to Sibyl Parr, RN of [phone]

## 2021-08-07 NOTE — Hospital Course (Addendum)
  Presented with AMS; pr daughter patient has been experiencing cognitive decline in over the past year. 7 falls in the past month. Patient found on bathroom floor after a fall, unknown how long he was down, CK >3000 on initial presentation. Has trended down since admission. AKI noted on arrival which has since resolved. Elevated LFTS 2/2 elevated CK which has since resolved following decrease in CK. Patient was found to be vitamin b12 deficient with level at 93. Vitamin b12 injection daily until discharge to SNF- transition to monthly injections.  Patient mentation has improved since admission. Family is on board with DC to SNF. Patient acknowledges and agrees with SNF placement. Pending SNF.

## 2021-08-07 NOTE — ED Notes (Signed)
Patient starting to drink contrast at this time for CT.  CT will be around 0515.

## 2021-08-07 NOTE — ED Notes (Signed)
Returned from CT.

## 2021-08-07 NOTE — ED Notes (Signed)
Attempted to give report to floor 5M01

## 2021-08-07 NOTE — ED Notes (Signed)
Patient started 2nd oral contrast

## 2021-08-07 NOTE — ED Notes (Signed)
Patient transported to MRI 

## 2021-08-08 ENCOUNTER — Other Ambulatory Visit: Payer: Self-pay

## 2021-08-08 DIAGNOSIS — R4182 Altered mental status, unspecified: Secondary | ICD-10-CM | POA: Diagnosis not present

## 2021-08-08 DIAGNOSIS — F0280 Dementia in other diseases classified elsewhere without behavioral disturbance: Secondary | ICD-10-CM

## 2021-08-08 DIAGNOSIS — E440 Moderate protein-calorie malnutrition: Secondary | ICD-10-CM | POA: Insufficient documentation

## 2021-08-08 LAB — CBC WITH DIFFERENTIAL/PLATELET
Abs Immature Granulocytes: 0.07 10*3/uL (ref 0.00–0.07)
Basophils Absolute: 0 10*3/uL (ref 0.0–0.1)
Basophils Relative: 0 %
Eosinophils Absolute: 0.3 10*3/uL (ref 0.0–0.5)
Eosinophils Relative: 3 %
HCT: 35.9 % — ABNORMAL LOW (ref 39.0–52.0)
Hemoglobin: 12.4 g/dL — ABNORMAL LOW (ref 13.0–17.0)
Immature Granulocytes: 1 %
Lymphocytes Relative: 9 %
Lymphs Abs: 0.8 10*3/uL (ref 0.7–4.0)
MCH: 32.7 pg (ref 26.0–34.0)
MCHC: 34.5 g/dL (ref 30.0–36.0)
MCV: 94.7 fL (ref 80.0–100.0)
Monocytes Absolute: 0.8 10*3/uL (ref 0.1–1.0)
Monocytes Relative: 8 %
Neutro Abs: 7.4 10*3/uL (ref 1.7–7.7)
Neutrophils Relative %: 79 %
Platelets: 123 10*3/uL — ABNORMAL LOW (ref 150–400)
RBC: 3.79 MIL/uL — ABNORMAL LOW (ref 4.22–5.81)
RDW: 15.3 % (ref 11.5–15.5)
WBC: 9.4 10*3/uL (ref 4.0–10.5)
nRBC: 0 % (ref 0.0–0.2)

## 2021-08-08 LAB — COMPREHENSIVE METABOLIC PANEL
ALT: 35 U/L (ref 0–44)
AST: 46 U/L — ABNORMAL HIGH (ref 15–41)
Albumin: 2.6 g/dL — ABNORMAL LOW (ref 3.5–5.0)
Alkaline Phosphatase: 61 U/L (ref 38–126)
Anion gap: 7 (ref 5–15)
BUN: 14 mg/dL (ref 8–23)
CO2: 25 mmol/L (ref 22–32)
Calcium: 8.2 mg/dL — ABNORMAL LOW (ref 8.9–10.3)
Chloride: 105 mmol/L (ref 98–111)
Creatinine, Ser: 0.92 mg/dL (ref 0.61–1.24)
GFR, Estimated: >60 mL/min (ref >60)
Glucose, Bld: 93 mg/dL (ref 70–99)
Potassium: 5.7 mmol/L — ABNORMAL HIGH (ref 3.5–5.1)
Sodium: 137 mmol/L (ref 135–145)
Total Bilirubin: 0.8 mg/dL (ref 0.3–1.2)
Total Protein: 5.3 g/dL — ABNORMAL LOW (ref 6.5–8.1)

## 2021-08-08 LAB — POTASSIUM: Potassium: 4.1 mmol/L (ref 3.5–5.1)

## 2021-08-08 LAB — CK: Total CK: 1138 U/L — ABNORMAL HIGH (ref 49–397)

## 2021-08-08 MED ORDER — SODIUM ZIRCONIUM CYCLOSILICATE 5 G PO PACK
5.0000 g | PACK | Freq: Once | ORAL | Status: DC
  Filled 2021-08-08: qty 1

## 2021-08-08 MED ORDER — ENSURE ENLIVE PO LIQD
237.0000 mL | Freq: Two times a day (BID) | ORAL | Status: DC
  Administered 2021-08-09 – 2021-08-10 (×4): 237 mL via ORAL

## 2021-08-08 MED ORDER — ISOSORBIDE MONONITRATE ER 30 MG PO TB24
30.0000 mg | ORAL_TABLET | Freq: Every day | ORAL | Status: DC
  Administered 2021-08-09 – 2021-08-10 (×2): 30 mg via ORAL
  Filled 2021-08-08 (×3): qty 1

## 2021-08-08 NOTE — Plan of Care (Signed)
  Problem: Education: Goal: Knowledge of General Education information will improve Description: Including pain rating scale, medication(s)/side effects and non-pharmacologic comfort measures Outcome: Completed/Met

## 2021-08-08 NOTE — Plan of Care (Signed)
  Problem: Clinical Measurements: Goal: Diagnostic test results will improve Outcome: Completed/Met

## 2021-08-08 NOTE — NC FL2 (Signed)
[place] MEDICAID FL2 LEVEL OF CARE SCREENING TOOL     IDENTIFICATION  Patient Name: SHION BLUESTEIN Birthdate: Jun 22, 1941 Sex: male Admission Date (Current Location): 08/06/2021  [place] and [place] Number:   ([place], [place]   Facility and Address:  [facility], [address] [place], [place] [address]      Provider Number: [id number]  Attending Physician Name and Address:  Gust Rung, DO  Relative Name and Phone Number:       Current Level of Care: Hospital Recommended Level of Care: Skilled Nursing Facility Prior Approval Number:    Date Approved/Denied:   PASRR Number:    Discharge Plan: SNF    Current Diagnoses: Patient Active Problem List   Diagnosis Date Noted   Vitamin B12 deficiency 08/07/2021   Dementia (HCC) 08/07/2021   Weight loss, abnormal 08/07/2021   Aortic atherosclerosis (HCC) 08/07/2021   Altered mental status, unspecified 08/06/2021    Orientation RESPIRATION BLADDER Height & Weight     Self, Place  Normal Incontinent, External catheter Weight: 70.3 kg Height:  5\' 8"  (172.7 cm) (11/08/15 encounter)  BEHAVIORAL SYMPTOMS/MOOD NEUROLOGICAL BOWEL NUTRITION STATUS      Incontinent Diet (Heart healthy)  AMBULATORY STATUS COMMUNICATION OF NEEDS Skin   Limited Assist Verbally Normal                       Personal Care Assistance Level of Assistance  Bathing, Dressing, Feeding, Total care Bathing Assistance: Limited assistance Feeding assistance: Independent (After set up) Dressing Assistance: Limited assistance Total Care Assistance: Limited assistance   Functional Limitations Info  Sight, Hearing, Speech Sight Info: Impaired (Wears glasses) Hearing Info: Adequate Speech Info: Adequate    SPECIAL CARE FACTORS FREQUENCY  PT (By licensed PT), OT (By licensed OT)     PT Frequency: 5x/week OT Frequency: 5x/week            Contractures Contractures Info: Present (Bilateral  fingers.)    Additional Factors Info  Code Status, Allergies Code Status Info: Full Code Allergies Info: No Known Allergies           Current Medications (08/08/2021):  This is the current hospital active medication list Current Facility-Administered Medications  Medication Dose Route Frequency Provider Last Rate Last Admin   acetaminophen (TYLENOL) tablet 650 mg  650 mg Oral Q6H PRN 08/10/2021, MD   650 mg at 08/07/21 1028   Or   acetaminophen (TYLENOL) suppository 650 mg  650 mg Rectal Q6H PRN 08/09/21, MD       amLODipine (NORVASC) tablet 10 mg  10 mg Oral Daily Steffanie Rainwater, MD   10 mg at 08/08/21 08/10/21   aspirin EC tablet 81 mg  81 mg Oral Daily 5784, MD   81 mg at 08/08/21 08/10/21   cyanocobalamin ((VITAMIN B-12)) injection 1,000 mcg  1,000 mcg Intramuscular Daily 6962 C, DO   1,000 mcg at 08/08/21 0954   enoxaparin (LOVENOX) injection 40 mg  40 mg Subcutaneous Q24H 08/10/21, MD   40 mg at 08/07/21 2109   isosorbide mononitrate (IMDUR) 24 hr tablet 30 mg  30 mg Oral Daily 2110, MD       losartan (COZAAR) tablet 100 mg  100 mg Oral Daily Dellis Filbert, MD   100 mg at 08/08/21 08/10/21   multivitamin with minerals tablet 1 tablet  1 tablet Oral Daily 9528, DO   1  tablet at 08/08/21 0953   pantoprazole (PROTONIX) EC tablet 40 mg  40 mg Oral Daily Steffanie Rainwater, MD   40 mg at 08/08/21 2992   senna-docusate (Senokot-S) tablet 1 tablet  1 tablet Oral QHS PRN Steffanie Rainwater, MD       tamsulosin [facility]) capsule 0.4 mg  0.4 mg Oral BID Steffanie Rainwater, MD   0.4 mg at 08/08/21 4268     Discharge Medications: Please see discharge summary for a list of discharge medications.  Relevant Imaging Results:  Relevant Lab Results:   Additional Information SSN# [id number]  [name], [name], RN

## 2021-08-08 NOTE — Progress Notes (Signed)
   Subjective: I seen and evaluated [name] at bedside.  He was alert and oriented x3.  He states some abdominal discomfort and reports he has not had a bowel movement today.  He is otherwise well.  He is aware that he is awaiting SNF placement.  He denies headache, chest pain, or shortness of breath.  Objective:  Vital signs in last 24 hours: Vitals:   08/07/21 2106 08/08/21 0449 08/08/21 0901 08/08/21 0939  BP: 129/63 (!) 150/78  (!) 166/73  Pulse: 71 (!) 57  60  Resp: 18 18  18   Temp: 98.6 F (37 C) 97.8 F (36.6 C)  98.3 F (36.8 C)  TempSrc:  Oral    SpO2: 100% 96%  97%  Weight:      Height:   5\' 8"  (1.727 m)    Physical Exam HENT:     Head: Normocephalic and atraumatic.  Cardiovascular:     Rate and Rhythm: Normal rate and regular rhythm.     Heart sounds: Normal heart sounds.  Pulmonary:     Effort: Pulmonary effort is normal.     Breath sounds: Normal breath sounds.  Genitourinary:    Comments: Condom cath present Musculoskeletal:     Right lower leg: No edema.     Left lower leg: No edema.  Skin:    General: Skin is warm and dry.  Neurological:     Mental Status: He is alert and oriented to person, place, and time.  Psychiatric:        Behavior: Behavior normal. Behavior is cooperative.     Assessment/Plan:  Active Problems:   Altered mental status, unspecified   Vitamin B12 deficiency   Dementia (HCC)   Weight loss, abnormal   Aortic atherosclerosis (HCC)  Altered mental status with frequent falls #Unintentional weight loss #Possible Dementia # Vitamin B12 deficiency --Vitamin B12 injection ordered daily x7 days, may be changed to weekly for 1 month when discharged to SNF --PT/OT recommends SNF --Trend CBC, BMP --TOC consulted for SNF placement   Rhabdomyolysis CK is trending down at 1138 down from 2072   AKI, resolved Current Cr is WNL at 1.03.   Transaminitis 2/2 elevated CK, expect to normalize with CK. --Trend CMP    #HTN -amlodipine 10 mg p.o. daily -losartan 100mg  daily   #CAD -Continue ASA 81 mg -Nitroglycerin as needed  #BPH -Continue tamsulosin 0.4 mg p.o. twice daily   #GERD -Continue Protonix 40 mg p.o. daily   Prior to Admission Living Arrangement: Anticipated Discharge Location: Barriers to Discharge: Dispo: Anticipated discharge in approximately 2-3 day(s).   , MD 08/08/2021, 9:47 AM Pager: [phone] After 5pm on weekdays and 1pm on weekends: On Call pager [phone]

## 2021-08-08 NOTE — Care Management Obs Status (Signed)
MEDICARE OBSERVATION STATUS NOTIFICATION   Patient Details  Name: [name] MRN: [id number] Date of Birth: [birth year]   Medicare Observation Status Notification Given:  Yes    [name], [name], RN 08/08/2021, 9:52 AM

## 2021-08-08 NOTE — Evaluation (Signed)
Occupational Therapy Evaluation Patient Details Name: [name] MRN: [id number] DOB: 1941-09-03 Today's Date: 08/08/2021    History of Present Illness The pt is an 80 yo male presenting 8/28 with AMS and repeated falls. Upon work-up pt found to have B12 deficiency, rhabdomyolysis, and AKI. PMH includes: HTN and prior MI.   Clinical Impression   Patient is currently requiring assistance with ADLs including total assist with toileting, and with LE dressing, maximum assist with bathing at bed level, moderate assist with UE dressing and at least minimal assist with grooming and eating either in bed or fully supported in recliner, all of which may be  below patient's typical baseline however pt is an unreliable historian and no family was present to give baseline information. During this evaluation, patient was limited by poor sitting and standing balance with retropulsion with both bed mobility and sitting EOB, generalized weakness, impaired activity tolerance, confusion, and hand contractures with inability to open either hand with RT more affected than LT, which has the potential to impact patient's safety and independence during functional mobility, as well as performance for ADLs.  Patient lives with his sister, who is able to provide 24/7 supervision and assistance, per pt report.  Patient demonstrates fair rehab potential, and should benefit from continued skilled occupational therapy services while in acute care to maximize safety, independence and quality of life at home.  Continued occupational therapy services in a SNF setting prior to return home is recommended.  ?     Follow Up Recommendations  SNF;Supervision/Assistance - 24 hour    Equipment Recommendations   (Will defer to post-acute recommendations.)    Recommendations for Other Services       Precautions / Restrictions Precautions Precautions: Fall Precaution Comments: pt with frequent falls last 2-3  months Restrictions Weight Bearing Restrictions: No      Mobility Bed Mobility Overal bed mobility: Needs Assistance Bed Mobility: Supine to Sit     Supine to sit: Max assist;HOB elevated     General bed mobility comments: Pt initially retropulsive with all assist to raise trunk from matress. Increased cues/time/effort to have pt use UEs on bed rail to help him roll to side then push up with Max As and cues for sequence.    Transfers Overall transfer level: Needs assistance Equipment used: Rolling walker (2 wheeled) Transfers: Sit to/from Stand Sit to Stand: Mod assist;Max assist         General transfer comment: Moderate assist to power up from EOB with increased time promoting anterior weight shift. Cues for hands and Total Assist to scoot anteriorly to EOB for increased ease when standing.  Max As to control descent to recliner with multimodal cues and increased time to process for UE reach back.    Balance Overall balance assessment: Needs assistance;History of Falls Sitting-balance support: Bilateral upper extremity supported;Feet supported Sitting balance-Leahy Scale: Poor Sitting balance - Comments: reliant on BUE and posterior losses of balance. Pt sat EOB ~10 mon working on sitting balance and progressing to close supervision for minutes at a time, but maintaining BUE support on bed. Postural control: Posterior lean Standing balance support: Bilateral upper extremity supported;During functional activity Standing balance-Leahy Scale: Poor Standing balance comment: reliant on BUE support on RW and modA                           ADL either performed or assessed with clinical judgement   ADL Overall ADL's : Needs  assistance/impaired Eating/Feeding: Set up;Minimal assistance;Sitting Eating/Feeding Details (indicate cue type and reason): Pt required all containers opened for him and food pre-cut. Placed red adaptive foam handle on pt's fork to increase ease  with self feeding and pt reported, "Hey I have these at home!" Pt ablet o hold utensil in RT hand with increased effort, and feed self once food pre-cut for him. Grooming: Wash/dry hands;Sitting;Set up;Minimal assistance   Upper Body Bathing: Bed level;Minimal assistance   Lower Body Bathing: Maximal assistance;Bed level;Sitting/lateral leans   Upper Body Dressing : Moderate assistance;Sitting   Lower Body Dressing: Total assistance;Bed level;Sitting/lateral leans Lower Body Dressing Details (indicate cue type and reason): Total assist to adjust socks and don pt's slide on shoes while pt sitting EOB. Toilet Transfer: RW;BSC;Moderate assistance;Maximal Dentistassistance Toilet Transfer Details (indicate cue type and reason): Simulated to recliner as pt using his catheter with cues as pt was unaware of it's presence and it's purpose. Pt stood from EOB with Moderate assist after extensive training, cues and rocking motion to promote anterior weight shift. Pt used RW to pivot to recliner with Min As, slow with step by step cues and assist to manage RW. Pt with poor control on descent to recliner with need of Max As for safety. Toileting- Clothing Manipulation and Hygiene: Total assistance Toileting - Clothing Manipulation Details (indicate cue type and reason): Catheter with cues to use (see toilet tranfser note)             Vision Baseline Vision/History: 1 Wears glasses Patient Visual Report: No change from baseline Additional Comments: Pt denied changes from baseline.     Perception     Praxis      Pertinent Vitals/Pain Pain Assessment: No/denies pain Pain Intervention(s): Monitored during session     Hand Dominance Right   Extremity/Trunk Assessment Upper Extremity Assessment Upper Extremity Assessment: Generalized weakness;RUE deficits/detail;LUE deficits/detail RUE Deficits / Details: Impaired hand function with increased effort/concentration to make and maintain grip and severely  limited active finger extension. Pt must manually use LT hand to open RT fingers with some apparent PIP contractures. RUE Sensation: WNL RUE Coordination: decreased fine motor LUE Deficits / Details: Impaired hand function with increased effort/concentration to make and maintain grip and moderately limited active finger extension LUE Sensation: WNL LUE Coordination: decreased fine motor   Lower Extremity Assessment Lower Extremity Assessment: Defer to PT evaluation   Cervical / Trunk Assessment Cervical / Trunk Assessment: Kyphotic   Communication Communication Communication:  (Soft spoken, dysphonia)   Cognition Arousal/Alertness: Awake/alert Behavior During Therapy: WFL for tasks assessed/performed;Flat affect Overall Cognitive Status: No family/caregiver present to determine baseline cognitive functioning Area of Impairment: Orientation;Memory;Following commands;Awareness;Problem solving;Safety/judgement                 Orientation Level: Situation (Pt able to state person, place [place] and date. Pt was unable to give accurate situation.)   Memory: Decreased short-term memory Following Commands: Follows one step commands consistently;Follows one step commands with increased time   Awareness: Intellectual Problem Solving: Slow processing;Decreased initiation;Difficulty sequencing;Requires verbal cues;Requires tactile cues General Comments: Pt givein information that is at times, inconsistent with recent information in chart.   General Comments       Exercises     Shoulder Instructions      Home Living Family/patient expects to be discharged to:: Skilled nursing facility Living Arrangements: Other relatives (Sister) Available Help at Discharge: Available 24 hours/day;Family (Pt reports sister is with him "almost 24/7". Pt remains with some confusion  and below history may be unreliable.) Type of Home: House Home Access: Stairs to enter [employer] of Steps: 5 Entrance Stairs-Rails: Right;Left;Can reach both Home Layout: Two level;Laundry or work area in basement;Able to live on main level with bedroom/bathroom Alternate Level Stairs-Number of Steps: Pt reports that he does not access basement.   Bathroom Shower/Tub: Chief Strategy Officer: Handicapped height     Home Equipment: Environmental consultant - 2 wheels;Walker - 4 wheels;Shower seat;Wheelchair - Careers adviser (comment);Cane - single point Event organiser which pt uses frequently)   Additional Comments: pt reports from home with his sister. reports he lives in a home with 3 steps to enter and a basement he does not need to use. pt is unreliable historian and no family present to confirm.      Prior Functioning/Environment Level of Independence: Needs assistance  Gait / Transfers Assistance Needed: Pt reported to physicalt herapy yesterday that he uses his cane, but reported to this OT that he uses his rollator at all times inside and his manual WC outside the home. ADL's / Homemaking Assistance Needed: pt reports independent except sister help with shoes.  Pt reports that his sister has been assisting with IADLs            OT Problem List: Decreased range of motion;Decreased strength;Decreased coordination;Decreased activity tolerance;Decreased safety awareness;Impaired tone;Decreased knowledge of use of DME or AE;Impaired balance (sitting and/or standing);Decreased knowledge of precautions;Impaired UE functional use      OT Treatment/Interventions: Self-care/ADL training;Balance training;Therapeutic exercise;Neuromuscular education;Therapeutic activities;Cognitive remediation/compensation;Energy conservation;DME and/or AE instruction;Patient/family education;Manual therapy    OT Goals(Current goals can be found in the care plan section) Acute Rehab OT Goals Patient Stated Goal: Pt needed cues to identify an appropriate goal. When asked what he would like to work  on in therapy pt reponded, "A pick up truck. Buy me a pick up truck"  After more cues pt able to agree that he would like to stop falling. OT Goal Formulation: With patient Time For Goal Achievement: 08/22/21 Potential to Achieve Goals: Fair ADL Goals Pt Will Perform Grooming: standing;with min guard assist (2/3 tasks) Pt Will Perform Lower Body Dressing: with min guard assist;with adaptive equipment;sitting/lateral leans;bed level;sit to/from stand Pt Will Transfer to Toilet: with supervision;bedside commode;ambulating Pt Will Perform Toileting - Clothing Manipulation and hygiene: with min guard assist;sitting/lateral leans Additional ADL Goal #1: Pt will demonstrate improved mentation by score no greater than a 5 on Short Blessed Test and answering 4/4 safety questions from [facility] correctly. Additional ADL Goal #2: Pt will demonstrate BUE HEP and self-stretching program in order to prevent further dysfunction and maximize use of BUEs for ADLs.  OT Frequency: Min 2X/week   Barriers to D/C: Inaccessible home environment  5 steps to enter. Will need to confirm available supervision with pt's sister.       Co-evaluation              AM-PAC OT "6 Clicks" Daily Activity     Outcome Measure Help from another person eating meals?: A Little Help from another person taking care of personal grooming?: A Little Help from another person toileting, which includes using toliet, bedpan, or urinal?: Total Help from another person bathing (including washing, rinsing, drying)?: A Lot Help from another person to put on and taking off regular upper body clothing?: A Lot Help from another person to put on and taking off regular lower body clothing?: Total 6 Click Score: 12   End of Session Equipment Utilized  During Treatment: Gait belt;Rolling walker Nurse Communication: Mobility status  Activity Tolerance: Patient tolerated treatment well Patient left: in chair;with call bell/phone within  reach;with chair alarm set  OT Visit Diagnosis: Unsteadiness on feet (R26.81);Other symptoms and signs involving cognitive function;Muscle weakness (generalized) (M62.81);History of falling (Z91.81);Repeated falls (R29.6);Feeding difficulties (R63.3)                Time: 2633-3545 OT Time Calculation (min): 41 min Charges:  OT General Charges $OT Visit: 1 Visit OT Evaluation $OT Eval Moderate Complexity: 1 Mod OT Treatments $Self Care/Home Management : 8-22 mins $Therapeutic Activity: 8-22 mins  Victorino Dike, OT Acute Rehab Services Office: [phone] 08/08/2021  Theodoro Clock 08/08/2021, 1:07 PM

## 2021-08-08 NOTE — TOC Initial Note (Signed)
Transition of Care [facility]) - Initial/Assessment Note    Patient Details  Name: [name] MRN: [id number] Date of Birth: 01/18/1941  [facility]) CM/SW Contact:    [name], [name], RN Phone Number: 08/08/2021, 4:03 PM  Clinical Narrative:                 CM consulted for [facility] needs for post hospitalization. Patient is alert and oriented to self and knows where he is but could not tell the reason why he is here at the hospital. Lives with wife who has dementia per H&P. Nephew lives with them and assists sometimes. Daughter visits frequently. CM called and spoke with [name], liaison for [facility] and Rehab. Documents transmitted to facility per request. States she will take a look at documents and get back with CM. CM called daughter and left her a secured message to call back. TOC will continue to follow with needs.  Expected Discharge Plan: Skilled Nursing Facility Barriers to Discharge: Continued Medical Work up   Patient Goals and CMS Choice Patient states their goals for this hospitalization and ongoing recovery are:: To go home. CMS Medicare.gov Compare Post Acute Care list provided to:: Patient Choice offered to / list presented to : Patient, Adult Children (Daughter)  Expected Discharge Plan and Services Expected Discharge Plan: Skilled Nursing Facility In-house Referral: Clinical Social Work Discharge Planning Services: CM Consult   Living arrangements for the past 2 months: Single Family Home                                      Prior Living Arrangements/Services Living arrangements for the past 2 months: Single Family Home Lives with:: Spouse, Relatives (Nephew lives with them at times.) Patient language and need for interpreter reviewed:: Yes Do you feel safe going back to the place where you live?: Yes      Need for Family Participation in Patient Care: Yes (Comment) Care giver support system in place?: Yes  (comment) Current home services: [facility]) Criminal Activity/Legal Involvement Pertinent to Current Situation/Hospitalization: No - Comment as needed  Activities of Daily Living Home Assistive Devices/Equipment: Other (Comment) (pt not able to tell me) ADL Screening (condition at time of admission) Patient's cognitive ability adequate to safely complete daily activities?: No Is the patient deaf or have difficulty hearing?: No Does the patient have difficulty seeing, even when wearing glasses/contacts?: No Does the patient have difficulty concentrating, remembering, or making decisions?: Yes Patient able to express need for assistance with ADLs?: Yes Does the patient have difficulty dressing or bathing?: Yes Independently performs ADLs?: No Does the patient have difficulty walking or climbing stairs?: Yes Weakness of Legs: Both Weakness of Arms/Hands: Both  Permission Sought/Granted Permission sought to share information with : Case Manager, Magazine features editor, Family Supports (Daughter) Permission granted to share information with : Yes, Verbal Permission Granted     Permission granted to share info w AGENCY: [facility]  Permission granted to share info w Relationship: Daughter     Emotional Assessment Appearance:: Appears stated age Attitude/Demeanor/Rapport: Engaged Affect (typically observed): Appropriate, Pleasant, Restless (Trying toget out of bed.) Orientation: : Oriented to Self, Oriented to Place Alcohol / Substance Use: Tobacco Use, Alcohol Use (Quit smoking cigarettes in 2009, drinks alcohol occassionally.) Psych Involvement: No (comment)  Admission diagnosis:  Fall [W19.XXXA] BPH (benign prostatic hyperplasia) [N40.0] Altered mental status, unspecified [  R41.82] Patient Active Problem List   Diagnosis Date Noted   Vitamin B12 deficiency 08/07/2021   Dementia (HCC) 08/07/2021   Weight loss, abnormal 08/07/2021   Aortic  atherosclerosis (HCC) 08/07/2021   Altered mental status, unspecified 08/06/2021   PCP:  Lorelei Pont, DO Pharmacy:   [id number] - [place], [place] - [address] [place] [address] Phone: [phone] Fax: [phone]     Social Determinants of Health (SDOH) Interventions    Readmission Risk Interventions No flowsheet data found.

## 2021-08-08 NOTE — Progress Notes (Signed)
Initial Nutrition Assessment  DOCUMENTATION CODES:   Non-severe (moderate) malnutrition in context of chronic illness  INTERVENTION:   -Downgrade diet to dysphagia 3 (advanced mechanical soft) for ease of intake -Ensure Enlive po BID, each supplement provides 350 kcal and 20 grams of protein  -MVI with minerals daily -Feeding assistance with meals  NUTRITION DIAGNOSIS:   Moderate Malnutrition related to chronic illness as evidenced by mild fat depletion, moderate fat depletion, mild muscle depletion, moderate muscle depletion.  GOAL:   Patient will meet greater than or equal to 90% of their needs  MONITOR:   PO intake, Supplement acceptance, Labs, Weight trends, Skin, I & O's  REASON FOR ASSESSMENT:   Consult Assessment of nutrition requirement/status  ASSESSMENT:   [name] is a 80 y.o. male with past medical history of HTN, prior MI, GERD, and BPH who presents to the ED with generalized weakness, worsening confusion, and frequent falls. History was mainly provided by daughter and to a lesser extent by the patient.  Pt admitted with acute encephalopathy with underlying dementia.   Reviewed I/O's: -303 ml x 24 hours and +1.7 L since admission  Case discussed with OT, who reports pt did well during session, but would benefit from adaptive utensils.   Spoke with pt at bedside, who was pleasant and in good spirits today. Assisted pt with cutting up food items on his tray. He reports he typically has a good appetite, consuming 3 meals per day (Breakfast: eggs, toast, and gravy, Lunch and Dinner: meat, starch, and vegetable). Pt shares that he has experienced a general decline in health over the past 2-3 months, where he has been eating less and losing weight. Per pt, he estimates he has lost 35# over the past two months. He reports his UBW is around 180#. However, no wt hx available to quantify this.   Noted pt with no teeth. He reports he has dentures at home, which he  eats with, but does not have them with him. He is amenable to diet downgrade.   Discussed importance of good meal and supplement intake to promote healing.   Medications reviewed and include vitamin B-12.  Labs reviewed: K: 5.7.    NUTRITION - FOCUSED PHYSICAL EXAM:  Flowsheet Row Most Recent Value  Orbital Region Moderate depletion  Upper Arm Region Moderate depletion  Thoracic and Lumbar Region Mild depletion  Buccal Region Mild depletion  Temple Region Moderate depletion  Clavicle Bone Region Mild depletion  Clavicle and Acromion Bone Region Mild depletion  Scapular Bone Region Mild depletion  Dorsal Hand Moderate depletion  Patellar Region Moderate depletion  Anterior Thigh Region Moderate depletion  Posterior Calf Region Moderate depletion  Edema (RD Assessment) None  Hair Reviewed  Eyes Reviewed  Mouth Reviewed  Skin Reviewed  Nails Reviewed       Diet Order:   Diet Order             Diet Heart Room service appropriate? Yes; Fluid consistency: Thin  Diet effective now                   EDUCATION NEEDS:   Education needs have been addressed  Skin:  Skin Assessment: Reviewed RN Assessment  Last BM:  Unknown  Height:   Ht Readings from Last 1 Encounters:  08/08/21 5\' 8"  (1.727 m)    Weight:   Wt Readings from Last 1 Encounters:  08/07/21 70.3 kg    Ideal Body Weight:  70 kg  BMI:  Body mass index is 23.57 kg/m.  Estimated Nutritional Needs:   Kcal:  1900-2100  Protein:  90-105 grams  Fluid:  > 1.9 L    Levada Schilling, RD, LDN, CDCES Registered Dietitian II Certified Diabetes Care and Education Specialist Please refer to [facility] for RD and/or RD on-call/weekend/after hours pager

## 2021-08-09 DIAGNOSIS — R4182 Altered mental status, unspecified: Secondary | ICD-10-CM | POA: Diagnosis not present

## 2021-08-09 DIAGNOSIS — F0280 Dementia in other diseases classified elsewhere without behavioral disturbance: Secondary | ICD-10-CM | POA: Diagnosis not present

## 2021-08-09 LAB — COMPREHENSIVE METABOLIC PANEL
ALT: 36 U/L (ref 0–44)
AST: 36 U/L (ref 15–41)
Albumin: 2.9 g/dL — ABNORMAL LOW (ref 3.5–5.0)
Alkaline Phosphatase: 72 U/L (ref 38–126)
Anion gap: 8 (ref 5–15)
BUN: 10 mg/dL (ref 8–23)
CO2: 26 mmol/L (ref 22–32)
Calcium: 8.7 mg/dL — ABNORMAL LOW (ref 8.9–10.3)
Chloride: 104 mmol/L (ref 98–111)
Creatinine, Ser: 0.86 mg/dL (ref 0.61–1.24)
GFR, Estimated: >60 mL/min (ref >60)
Glucose, Bld: 98 mg/dL (ref 70–99)
Potassium: 4 mmol/L (ref 3.5–5.1)
Sodium: 138 mmol/L (ref 135–145)
Total Bilirubin: 0.8 mg/dL (ref 0.3–1.2)
Total Protein: 6.1 g/dL — ABNORMAL LOW (ref 6.5–8.1)

## 2021-08-09 NOTE — Plan of Care (Signed)

## 2021-08-09 NOTE — TOC Progression Note (Signed)
Transition of Care [facility]) - Progression Note    Patient Details  Name: NASEER HEARN MRN: [id number] Date of Birth: 09-Apr-1941  [facility]) CM/SW Contact  [name], [name], RN Phone Number: 08/09/2021, 1:35 PM  Clinical Narrative:    CM received a call from [name] with [facility] and Rehab and she stated that the facility has a bed approved for patient but is waiting on Insurance authorization. Will update when confirmed. TOC continues to follow with needs.   Expected Discharge Plan: Skilled Nursing Facility Barriers to Discharge: Continued Medical Work up  Expected Discharge Plan and Services Expected Discharge Plan: Skilled Nursing Facility In-house Referral: Clinical Social Work Discharge Planning Services: CM Consult   Living arrangements for the past 2 months: Single Family Home                                       Social Determinants of Health (SDOH) Interventions    Readmission Risk Interventions No flowsheet data found.

## 2021-08-09 NOTE — Progress Notes (Signed)
   Subjective: I seen and evaluated [name] at bedside.  He states that he is feeling fine.  He has no acute complaints at this time.  Objective:  Vital signs in last 24 hours: Vitals:   08/08/21 1700 08/08/21 2159 08/09/21 0916 08/09/21 0945  BP: (!) 151/72 (!) 148/69 (!) 132/120 127/86  Pulse: 65 69 76 79  Resp: 17 18 18    Temp: 98.2 F (36.8 C) 98.3 F (36.8 C)    TempSrc:  Oral    SpO2: 98% 100% 100%   Weight:  70.3 kg    Height:       Physical Exam HENT:     Head: Normocephalic and atraumatic.  Cardiovascular:     Rate and Rhythm: Normal rate and regular rhythm.     Heart sounds: Normal heart sounds.  Pulmonary:     Effort: Pulmonary effort is normal.     Breath sounds: Normal breath sounds.  Musculoskeletal:     Right lower leg: No edema.     Left lower leg: No edema.  Skin:    General: Skin is warm and dry.  Neurological:     Mental Status: He is alert. Mental status is at baseline.     Comments: Patient has intermittent episodes of disorientation   Psychiatric:        Behavior: Behavior normal. Behavior is cooperative.     Assessment/Plan:  Active Problems:   Altered mental status, unspecified   Vitamin B12 deficiency   Dementia (HCC)   Weight loss, abnormal   Aortic atherosclerosis (HCC)   Malnutrition of moderate degree  Altered mental status with frequent falls #Unintentional weight loss #Possible Dementia # Vitamin B12 deficiency --Vitamin B12 injection ordered daily x7 days, may be changed to weekly for 1 month when discharged to SNF --Awaiting SNF placement --Trend CBC, BMP    #HTN -amlodipine 10 mg p.o. daily -losartan 100mg  daily   #CAD -Continue ASA 81 mg -Nitroglycerin as needed  #BPH -Continue tamsulosin 0.4 mg p.o. twice daily   #GERD -Continue Protonix 40 mg p.o. daily  Prior to Admission Living Arrangement: Anticipated Discharge Location: Barriers to Discharge: Dispo: Anticipated discharge in approximately  1-2 day(s).   , MD 08/09/2021, 2:14 PM Pager: [phone] After 5pm on weekdays and 1pm on weekends: On Call pager [phone]

## 2021-08-10 DIAGNOSIS — R634 Abnormal weight loss: Secondary | ICD-10-CM | POA: Diagnosis not present

## 2021-08-10 DIAGNOSIS — R4182 Altered mental status, unspecified: Secondary | ICD-10-CM | POA: Diagnosis not present

## 2021-08-10 DIAGNOSIS — F0280 Dementia in other diseases classified elsewhere without behavioral disturbance: Secondary | ICD-10-CM | POA: Diagnosis not present

## 2021-08-10 LAB — SARS CORONAVIRUS 2 (TAT 6-24 HRS): SARS Coronavirus 2: NEGATIVE

## 2021-08-10 MED ORDER — SENNOSIDES-DOCUSATE SODIUM 8.6-50 MG PO TABS: 1.0000 | ORAL_TABLET | Freq: Every evening | ORAL | 0 refills | Status: AC | PRN

## 2021-08-10 MED ORDER — ENSURE ENLIVE PO LIQD: 237.0000 mL | Freq: Two times a day (BID) | ORAL | 12 refills | Status: AC

## 2021-08-10 MED ORDER — VITAMIN B-12 1000 MCG PO TABS: 1000.0000 ug | ORAL_TABLET | Freq: Every day | ORAL | 0 refills | Status: AC

## 2021-08-10 MED ORDER — ADULT MULTIVITAMIN W/MINERALS CH: 1.0000 | ORAL_TABLET | Freq: Every day | ORAL | 0 refills | Status: AC

## 2021-08-10 NOTE — Discharge Instructions (Addendum)
Thank you for allowing [place] to take care of you.  Please follow-up with for Primary Care Provider in 1 week to recheck your B12 level, blood work.  You came to the hospital for frequent falls and confusion. Vitamin B12 deficiency - please take the vitamin B12 oral supplement daily   High blood pressure - please continue amlodipine 10 mg daily and losartan 100mg  daily   Coronary artery disease - please continue ASA 81 mg and nitroglycerin as needed for chest pain  BPH - please continue tamsulosin 0.4 mg p.o. twice daily   Reflux - please continue Protonix 40 mg p.o. daily

## 2021-08-10 NOTE — Plan of Care (Signed)
  Problem: Activity: Goal: Risk for activity intolerance will decrease Outcome: Not Progressing   

## 2021-08-10 NOTE — Progress Notes (Signed)
HD#0 Subjective:  Overnight Events: Unremarkable, stable   I have seen and evaluated Mr. Hyacinth Meeker with attending Dr. Oswaldo Done at bedside. He reported that he is feeling fine, and has not acute complaints at this time.  Objective:  Vital signs in last 24 hours: Vitals:   08/09/21 1821 08/09/21 2042 08/10/21 0426 08/10/21 0913  BP: 101/64 92/66 109/70 100/65  Pulse: 92 99 (!) 102 85  Resp: 17 16 19 19   Temp: 98.2 F (36.8 C) 98.2 F (36.8 C) 98 F (36.7 C) 97.8 F (36.6 C)  TempSrc: Oral Oral Oral Oral  SpO2: 100% 96%  95%  Weight:   151 lb 0.2 oz (68.5 kg)   Height:       Supplemental O2: Room Air SpO2: 95 %   Physical Exam:  Physical Exam Vitals and nursing note reviewed.  HENT:     Head: Normocephalic and atraumatic.  Eyes:     Extraocular Movements: Extraocular movements intact.  Cardiovascular:     Rate and Rhythm: Tachycardia present.     Heart sounds: No murmur heard.   No friction rub. No gallop.  Pulmonary:     Effort: Pulmonary effort is normal.     Breath sounds: Normal breath sounds.  Abdominal:     Palpations: Abdomen is soft.  Musculoskeletal:     Cervical back: Normal range of motion.  Skin:    General: Skin is warm and dry.  Neurological:     Mental Status: He is alert.    Filed Weights   08/07/21 1604 08/08/21 2159 08/10/21 0426  Weight: 154 lb 15.7 oz (70.3 kg) 154 lb 15.7 oz (70.3 kg) 151 lb 0.2 oz (68.5 kg)     Intake/Output Summary (Last 24 hours) at 08/10/2021 1449 Last data filed at 08/09/2021 1700 Gross per 24 hour  Intake 120 ml  Output 650 ml  Net -530 ml   Net IO Since Admission: 1,816 mL [08/10/21 1449]  Pertinent Labs: CBC Latest Ref Rng & Units 08/08/2021 08/07/2021 08/06/2021  WBC 4.0 - 10.5 K/uL 9.4 10.7(H) 13.8(H)  Hemoglobin 13.0 - 17.0 g/dL 12.4(L) 12.8(L) 15.2  Hematocrit 39.0 - 52.0 % 35.9(L) 38.0(L) 45.4  Platelets 150 - 400 K/uL 123(L) PLATELET CLUMPS NOTED ON SMEAR, UNABLE TO ESTIMATE 132(L)    CMP Latest Ref  Rng & Units 08/09/2021 08/08/2021 08/08/2021  Glucose 70 - 99 mg/dL 98 - 93  BUN 8 - 23 mg/dL 10 - 14  Creatinine 08/10/2021 - 1.24 mg/dL 9.40 - 7.68  Sodium 0.88 - 145 mmol/L 138 - 137  Potassium 3.5 - 5.1 mmol/L 4.0 4.1 5.7(H)  Chloride 98 - 111 mmol/L 104 - 105  CO2 22 - 32 mmol/L 26 - 25  Calcium 8.9 - 10.3 mg/dL 110) - 8.2(L)  Total Protein 6.5 - 8.1 g/dL 6.1(L) - 5.3(L)  Total Bilirubin 0.3 - 1.2 mg/dL 0.8 - 0.8  Alkaline Phos 38 - 126 U/L 72 - 61  AST 15 - 41 U/L 36 - 46(H)  ALT 0 - 44 U/L 36 - 35    Imaging: No results found.  Assessment/Plan:   Active Problems:   Altered mental status, unspecified   Vitamin B12 deficiency   Dementia (HCC)   Weight loss, abnormal   Aortic atherosclerosis (HCC)   Malnutrition of moderate degree   Patient Summary: GLADE STRAUSSER is a 80 y.o. with a pertinent PMH of HTN, prior MI, GERD, BPH who presented with AMS, generalized weakness, frequent falls.   Altered mental  status with frequent falls #Unintentional weight loss #Possible Dementia # Vitamin B12 deficiency --Vitamin B12 injection ordered daily x7 days, may be changed to weekly for 1 month when discharged to SNF --Discharging to SNF tomorrow (9/2) --Trend CBC, BMP    #HTN -amlodipine 10 mg p.o. daily -losartan 100mg  daily   #CAD -Continue ASA 81 mg -Nitroglycerin as needed  #BPH -Continue tamsulosin 0.4 mg p.o. twice daily   #GERD -Continue Protonix 40 mg p.o. daily    Dispo: Anticipated discharge in approximately 1-2 day(s).   Diet:  Thin fluid consistency IVF: None,None VTE: Enoxaparin Code: Full PT/OT recs: SNF for Subacute PT, wheelchair.    Dispo: Anticipated discharge to Skilled nursing facility in 1 days pending 08/11/2021.   10/11/2021, DO 08/10/2021, 2:49 PM Pager: [phone]  Please contact the on call pager after 5 pm and on weekends at [phone].

## 2021-08-10 NOTE — Progress Notes (Signed)
Physical Therapy Treatment Patient Details Name: [name] MRN: [id number] DOB: [birth year] Today's Date: 08/10/2021    History of Present Illness The pt is an 80 yo male presenting 8/28 with AMS and repeated falls. Upon work-up pt found to have B12 deficiency, rhabdomyolysis, and AKI. PMH includes: HTN and prior MI.    PT Comments    Pt tolerates treatment well, transferring and ambulating for multiple trials. Pt remains very confused with limited awareness of deficits initially, and is at a high falls risk due to weakness and imbalance. Pt continues to require physical assistance to maintain safety and reduce falls risk during all aspects of mobility at this time. PT continues to recommend SNF placement currently.   Follow Up Recommendations  SNF;Supervision/Assistance - 24 hour     Equipment Recommendations  Wheelchair (measurements PT)    Recommendations for Other Services       Precautions / Restrictions Precautions Precautions: Fall Precaution Comments: pt with frequent falls last 2-3 months Restrictions Weight Bearing Restrictions: No    Mobility  Bed Mobility Overal bed mobility: Needs Assistance Bed Mobility: Supine to Sit     Supine to sit: Mod assist;HOB elevated          Transfers Overall transfer level: Needs assistance Equipment used: Rolling walker (2 wheeled) Transfers: Sit to/from Stand Sit to Stand: Min assist;Mod assist         General transfer comment: minA from recliner, modA from bed  Ambulation/Gait Ambulation/Gait assistance: Min assist Gait Distance (Feet): 30 Feet (additional trial of 15') Assistive device: Rolling walker (2 wheeled) Gait Pattern/deviations: Step-to pattern Gait velocity: reduced Gait velocity interpretation: <1.31 ft/sec, indicative of household ambulator General Gait Details: pt with slowed step-to gait, increased trunk flexion, mild lateral instability   Stairs             Wheelchair Mobility     Modified Rankin (Stroke Patients Only)       Balance Overall balance assessment: Needs assistance Sitting-balance support: Single extremity supported;Bilateral upper extremity supported Sitting balance-Leahy Scale: Poor Sitting balance - Comments: minA with UE support of bed or hand hold Postural control: Posterior lean Standing balance support: Bilateral upper extremity supported Standing balance-Leahy Scale: Poor Standing balance comment: reliant on UE support of RW and minG-minA, tendency for posterior lean                            Cognition Arousal/Alertness: Awake/alert Behavior During Therapy: WFL for tasks assessed/performed Overall Cognitive Status: Impaired/Different from baseline Area of Impairment: Orientation;Attention;Memory;Safety/judgement;Awareness;Problem solving                 Orientation Level: Disoriented to;Place;Time;Situation Current Attention Level: Sustained Memory: Decreased short-term memory Following Commands: Follows one step commands consistently Safety/Judgement: Decreased awareness of safety;Decreased awareness of deficits Awareness: Intellectual          Exercises      General Comments General comments (skin integrity, edema, etc.): VSS on RA      Pertinent Vitals/Pain Pain Assessment: Faces Faces Pain Scale: Hurts little more Pain Location: abdomen Pain Descriptors / Indicators: Grimacing Pain Intervention(s): Monitored during session    Home Living                      Prior Function            PT Goals (current goals can now be found in the care plan section) Acute Rehab PT Goals Patient Stated  Goal: to walk better Progress towards PT goals: Progressing toward goals    Frequency    Min 2X/week      PT Plan Current plan remains appropriate    Co-evaluation              AM-PAC PT "6 Clicks" Mobility   Outcome Measure  Help needed turning from your back to your side while in  a flat bed without using bedrails?: A Little Help needed moving from lying on your back to sitting on the side of a flat bed without using bedrails?: A Lot Help needed moving to and from a bed to a chair (including a wheelchair)?: A Little Help needed standing up from a chair using your arms (e.g., wheelchair or bedside chair)?: A Little Help needed to walk in hospital room?: A Little Help needed climbing 3-5 steps with a railing? : Total 6 Click Score: 15    End of Session   Activity Tolerance: Patient tolerated treatment well Patient left: in chair;with call bell/phone within reach;with chair alarm set;Other (comment) (mitts reapplied) Nurse Communication: Mobility status PT Visit Diagnosis: Other abnormalities of gait and mobility (R26.89);Muscle weakness (generalized) (M62.81);History of falling (Z91.81)     Time: 5188-4166 PT Time Calculation (min) (ACUTE ONLY): 27 min  Charges:  $Gait Training: 8-22 mins $Therapeutic Activity: 8-22 mins                     Arlyss Gandy, PT, DPT Acute Rehabilitation Pager: [phone]    Arlyss Gandy 08/10/2021, 11:45 AM

## 2021-08-10 NOTE — Progress Notes (Signed)
Patient refused CPAP for the night.  RT placed CPAP beside patients bed and instructed patient to have RN call RT if patient changes their mind.  Patient showed understanding.  RT will continue to monitor.

## 2021-08-10 NOTE — Discharge Summary (Addendum)
Name: [name] MRN: [id number] DOB: [birth year] 80 y.o. PCP: Lorelei Pont, DO  Date of Admission: 08/06/2021  3:07 PM Date of Discharge: 08/11/2021 Attending Physician: Tyson Alias, *  Discharge Diagnosis: 1. Probable Dementia with low functional status and frequent falls 2. Mild Traumatic Rhabdomyolysis 3. Vitamin B12 deficiency 4. HTN 5. CAD 6. BPH 7. GERD   Discharge Medications: Allergies as of 08/11/2021   No Known Allergies      Medication List     STOP taking these medications    cefUROXime 250 MG tablet Commonly known as: CEFTIN   predniSONE 20 MG tablet Commonly known as: DELTASONE   warfarin 2 MG tablet Commonly known as: COUMADIN       TAKE these medications    acetaminophen 650 MG CR tablet Commonly known as: TYLENOL Take 650 mg by mouth every 8 (eight) hours as needed for pain.   amLODipine 10 MG tablet Commonly known as: NORVASC Take 10 mg by mouth daily.   apixaban 2.5 MG Tabs tablet Commonly known as: ELIQUIS Take 1 tablet (2.5 mg total) by mouth 2 (two) times daily.   aspirin EC 81 MG tablet Take 81 mg by mouth daily.   cyclobenzaprine 10 MG tablet Commonly known as: FLEXERIL Take 10 mg by mouth every 8 (eight) hours.   famotidine 20 MG tablet Commonly known as: PEPCID Take 1 tablet (20 mg total) by mouth 2 (two) times daily.   feeding supplement Liqd Take 237 mLs by mouth 2 (two) times daily between meals.   ferrous sulfate 325 (65 FE) MG tablet Take 325 mg by mouth daily.   furosemide 20 MG tablet Commonly known as: LASIX Take 20 mg by mouth daily.   isosorbide mononitrate 30 MG 24 hr tablet Commonly known as: IMDUR Take 30 mg by mouth daily.   losartan 100 MG tablet Commonly known as: COZAAR Take 100 mg by mouth daily.   multivitamin with minerals Tabs tablet Take 1 tablet by mouth daily.   nitroGLYCERIN 0.4 MG SL tablet Commonly known as: NITROSTAT Place 0.4 mg under the tongue every 5  (five) minutes as needed for chest pain.   omeprazole 20 MG capsule Commonly known as: PRILOSEC Take 1 capsule (20 mg total) by mouth daily.   risperiDONE 1 MG tablet Commonly known as: RisperDAL Take 0.5 tablets (0.5 mg total) by mouth at bedtime as needed (For agitation).   senna-docusate 8.6-50 MG tablet Commonly known as: Senokot-S Take 1 tablet by mouth at bedtime as needed for mild constipation.   tamsulosin 0.4 MG Caps capsule Commonly known as: FLOMAX Take 0.4 mg by mouth 2 (two) times daily.   tiZANidine 2 MG tablet Commonly known as: ZANAFLEX Take 2 mg by mouth 2 (two) times daily.   vitamin B-12 1000 MCG tablet Commonly known as: CYANOCOBALAMIN Take 1 tablet (1,000 mcg total) by mouth daily.               Durable Medical Equipment  (From admission, onward)           Start     Ordered   08/10/21 1316  DME standard manual wheelchair with seat cushion  Once       Comments: Patient suffers from altered mental status which impairs their ability to perform daily activities like bathing, dressing, feeding, grooming, and toileting in the home.  A cane, crutch, or walker will not resolve issue with performing activities of daily living. A wheelchair will allow patient to safely perform  daily activities. Patient can safely propel the wheelchair in the home or has a caregiver who can provide assistance. Length of need Lifetime. Accessories: elevating leg rests (ELRs), wheel locks, extensions and anti-tippers.   08/10/21 1321            Disposition and follow-up:   [name] was discharged from [facility] in Stable condition.  At the hospital follow up visit please address:  1. Possible Dementia  2. Unintentional weight loss 3. Vitamin B12 deficiency, please recheck  4. HTN - recheck BP, CBC, BMP 5. CAD 6. BPH 7. GERD 8. History of pulmonary embolism, previously taking warfarin 2mg , but was not continued during hospital course.  For discharge, will discontinue warfarin and replace it with eliquis 2.5 two times daily. This is for the convenience of the patient.  Pulmonary embolism and warfarin was confirmed with patient's daughter, pharmacy, and cardiology [facility]). 9. Started Risperdal 0.5mg  PO nighly as needed for agitation related to dementia.  10.  Labs / imaging needed at time of follow-up: None  11.  Pending labs/ test needing follow-up: None    Follow-up Appointments:  Follow-up Information     [name], DO Follow up in 1 week(s).   Specialty: Family Medicine Contact information: [address] [place] [phone]         [phone], DO. Call in 1 week(s).   Specialty: Family Medicine Contact information: [address] [place] [phone]                 Hospital Course by problem list: Presented with AMS; pr daughter patient has been experiencing cognitive decline in over the past year. 7 falls in the past month. Patient found on bathroom floor after a fall, unknown how long he was down, CK >3000 on initial presentation. Has trended down since admission. AKI noted on arrival which has since resolved. Elevated LFTS 2/2 elevated CK which has since resolved following decrease in CK. Patient was found to be vitamin b12 deficient with level at 93. Vitamin b12 injection daily until discharge to SNF- transition to monthly injections.  Patient mentation has improved since admission with IV fluid and supportive care.   #Elevated CK - Resolved #AKI - Resolved #Transaminitis - Resolved Likely from patient being down in the bathroom for an unknown period of time after sustaining a fall. UA showed some hemoglobin but no RBCs. CK 3202. BUN 25, creatinine 1.34 (baseline ~1.1-1.2) - Received IV LR 1L bolus and IV LR at 100 mL/hr  Altered mental status with frequent falls #Unintentional weight  loss #Possible Dementia # Vitamin B12 deficiency --Vitamin B12 injection [phone] ordered daily x7 days, will continue with Vitamin B12 oral supplement --Patient had some moderate behavioral disturbances with intermittent aggressive behavior.  Started on risperidone 0.5 mg once daily with an additional 0.25 mg as needed for agitation.  No signs of underlying delirium, no other centrally acting medications or underlying infections to explain his behavior disturbances.   #HTN -Continued home amlodipine 10 mg p.o.daily -Continued home losartan 100mg  daily   #CAD -Continued ASA 81 mg -Continued Nitroglycerin as needed  #BPH -Continued tamsulosin 0.4 mg p.o. twice daily   #GERD -Continued Protonix 40 mg p.o. daily  Discharge Exam:   BP 110/69 (BP Location: Right Arm)   Pulse 99   Temp 98.1 F (36.7 C)   Resp 18   Ht 5\' 8"  (1.727 m)  Comment: 11/08/15 encounter  Wt 68.5 kg   SpO2 99%   BMI 22.96 kg/m  Discharge exam:  General: Pt is alert, awake, not in acute distress Cardiovascular: RRR, S1/S2 +, no rubs, no gallops Respiratory: CTA bilaterally, no wheezing, no rhonchi Abdominal: Soft, NT, ND, bowel sounds + Extremities: no edema, no cyanosis  Pertinent Labs, Studies, and Procedures:  MR Brain WO Contrast: No acute intracranial abnormality; Generalized volume loss and findings of chronic small vessel; Small right parietal scalp hematoma. disease. CT Head WO Contrast: Mild right parieto-occipital scalp soft tissue swelling without evidence of acute fracture or acute intracranial abnormality. CT Cervical Spine WO Contrast: Extensive chronic, degenerative and postoperative changes; No evidence of an acute fracture or subluxation.  Discharge Instructions: Discharge Instructions     (HEART FAILURE PATIENTS) Call MD:  Anytime you have any of the following symptoms: 1) 3 pound weight gain in 24 hours or 5 pounds in 1 week 2) shortness of breath, with or without a dry hacking  cough 3) swelling in the hands, feet or stomach 4) if you have to sleep on extra pillows at night in order to breathe.   Complete by: As directed    Call MD for:  difficulty breathing, headache or visual disturbances   Complete by: As directed    Call MD for:  extreme fatigue   Complete by: As directed    Call MD for:  hives   Complete by: As directed    Call MD for:  persistant dizziness or light-headedness   Complete by: As directed    Call MD for:  persistant nausea and vomiting   Complete by: As directed    Call MD for:  redness, tenderness, or signs of infection (pain, swelling, redness, odor or green/yellow discharge around incision site)   Complete by: As directed    Call MD for:  severe uncontrolled pain   Complete by: As directed    Call MD for:  temperature >100.4   Complete by: As directed    Diet - low sodium heart healthy   Complete by: As directed    PT/OT suggested advanced-mechanical soft diet   Increase activity slowly   Complete by: As directed    Increase activity slowly   Complete by: As directed    Walk with assistance   Complete by: As directed        Signed: Princess Bruins, DO 08/11/2021, 12:46 PM   Pager: [phone]

## 2021-08-10 NOTE — TOC Progression Note (Signed)
Transition of Care [facility]) - Progression Note    Patient Details  Name: [name] MRN: [id number] Date of Birth: 1941-07-16  Transition [facility]) CM/SW Contact  [name], [name], RN Phone Number: 08/10/2021, 2:26 PM  Clinical Narrative:    CM received a call from [name] at [facility] stating they will accept patient when medically ready and they have insurance authorization. MD notified as well as daughter, [name]. Checked with [facility] and confirmed distance was within appropriate mileage with no extra charge. Covid test ordered. Got vaccine information from patient's primary care physician office at [facility] family [facility] and faxed to [facility]. 14:30- Received a call from [name] at [facility] that they will not be able to accept patient today because the patient in the room he will be going to has to be moved to an isolation room therefore, patient will be accepted tomorrow. MD and daughter notified. TOC will continue to follow with needs.  Expected Discharge Plan: Skilled Nursing Facility Barriers to Discharge: Continued Medical Work up  Expected Discharge Plan and Services Expected Discharge Plan: Skilled Nursing Facility In-house Referral: Clinical Social Work Discharge Planning Services: CM Consult   Living arrangements for the past 2 months: Single Family Home Expected Discharge Date: 08/10/21                                     Social Determinants of Health (SDOH) Interventions    Readmission Risk Interventions No flowsheet data found.

## 2021-08-10 NOTE — Plan of Care (Signed)
  Problem: Clinical Measurements: Goal: Ability to maintain clinical measurements within normal limits will improve Outcome: Progressing Goal: Will remain free from infection Outcome: Progressing Goal: Respiratory complications will improve Outcome: Progressing Goal: Cardiovascular complication will be avoided Outcome: Progressing   Problem: Activity: Goal: Risk for activity intolerance will decrease Outcome: Not Progressing   Problem: Nutrition: Goal: Adequate nutrition will be maintained Outcome: Not Progressing

## 2021-08-11 ENCOUNTER — Other Ambulatory Visit: Payer: Self-pay

## 2021-08-11 ENCOUNTER — Encounter (HOSPITAL_COMMUNITY): Payer: Self-pay | Admitting: Internal Medicine

## 2021-08-11 DIAGNOSIS — I10 Essential (primary) hypertension: Secondary | ICD-10-CM

## 2021-08-11 DIAGNOSIS — I2510 Atherosclerotic heart disease of native coronary artery without angina pectoris: Secondary | ICD-10-CM

## 2021-08-11 DIAGNOSIS — M6282 Rhabdomyolysis: Secondary | ICD-10-CM | POA: Diagnosis not present

## 2021-08-11 DIAGNOSIS — F0280 Dementia in other diseases classified elsewhere without behavioral disturbance: Secondary | ICD-10-CM | POA: Diagnosis not present

## 2021-08-11 MED ORDER — LORAZEPAM 0.5 MG PO TABS: 0.5000 mg | ORAL_TABLET | Freq: Four times a day (QID) | ORAL | 0 refills | Status: DC | PRN

## 2021-08-11 MED ORDER — LORAZEPAM 2 MG/ML IJ SOLN
0.5000 mg | Freq: Four times a day (QID) | INTRAMUSCULAR | Status: DC | PRN
Start: 2021-08-11 — End: 2021-08-11
  Administered 2021-08-11 (×2): 0.5 mg via INTRAMUSCULAR
  Filled 2021-08-11 (×2): qty 1

## 2021-08-11 MED ORDER — APIXABAN 2.5 MG PO TABS: 2.5000 mg | ORAL_TABLET | Freq: Two times a day (BID) | ORAL | 0 refills | Status: AC

## 2021-08-11 MED ORDER — RISPERIDONE 1 MG PO TABS: 0.5000 mg | ORAL_TABLET | Freq: Every evening | ORAL | 2 refills | Status: AC | PRN

## 2021-08-11 MED ORDER — LORAZEPAM 2 MG/ML IJ SOLN: 0.5000 mg | Freq: Four times a day (QID) | INTRAMUSCULAR | 0 refills | Status: DC | PRN

## 2021-08-11 MED ORDER — LORAZEPAM 0.5 MG PO TABS: 0.5000 mg | ORAL_TABLET | Freq: Four times a day (QID) | ORAL | Status: DC | PRN

## 2021-08-11 NOTE — Therapy (Signed)
Occupational Therapy Treatment Patient Details Name: [name] MRN: [id number] DOB: [birth year] Today's Date: 08/11/2021    History of present illness The pt is an 80 yo male presenting 8/28 with AMS and repeated falls. Upon work-up pt found to have B12 deficiency, rhabdomyolysis, and AKI. PMH includes: HTN and prior MI.   OT comments  Pt participated in bed level ADL with overall Min-total assist. 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 Overall decreased awareness of deficits and disoriented throughout. Pt was educated in role of OT and goals were reviewed verbally. Discussed importance of attempting OOB activity to assist with increasing activity tolerance.   Follow Up Recommendations  SNF;Supervision/Assistance - 24 hour    Equipment Recommendations  Other (comment) (Defer to next venue)    Recommendations for Other Services      Precautions / Restrictions Precautions Precautions: Fall Precaution Comments: pt with frequent falls last 2-3 months       Mobility Bed Mobility Overal bed mobility: Needs Assistance Bed Mobility: Supine to Sit     Supine to sit: Mod assist;HOB elevated     General bed mobility comments: Increased cues/time/effort to have pt use UEs in bed to assist with repositioning and sitting up midline, cues for sequencing.    Transfers Overall transfer level: Needs assistance     General transfer comment: Pt declined OOB transfers today stating "I can not do that, Pt agreeable to bed elevel ADL's only this date. Pt appears disoriented.    Balance       Sitting balance - Comments:  (Pt declined sitting up at EOB today. Agreeable to bed level ADL and repositioning for comfort at conclusion of therapy session only despite maximal encouragement.)       ADL either performed or assessed with clinical judgement   ADL Overall ADL's : Needs assistance/impaired Eating/Feeding: Set  up;Minimal assistance;Sitting   Grooming: Wash/dry hands;Wash/dry face;Oral care;Minimal assistance;Bed level;Cueing for sequencing (HOB elevated. Pt declined OOB or sitting up at EOB. Appears confused)   Upper Body Bathing: Minimal assistance;Bed level;Cueing for sequencing (HOB elevated)   Lower Body Bathing: Maximal assistance;Bed level (Pt was able to bathe peri area and thighs with Min A/set-up. Max A for calves down, bilateral LEs)   Upper Body Dressing : Moderate assistance;Bed level    Functional mobility during ADLs:  (Bed level ADL completed today, pt declined any OOB activity, including sitting up at EOB. Confused/disoriented this morning) General ADL Comments: Pt participated in bed level ADL with overall Min-total assist. 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 Overall decreased awareness of deficits and disoriented throughout. Pt was educated in role of OT and goals were reviewed verbally. Discussed importance of attempting OOB activity to assist with increasing activity tolerance.    Cognition Arousal/Alertness: Awake/alert Behavior During Therapy: WFL for tasks assessed/performed Overall Cognitive Status: Impaired/Different from baseline Area of Impairment: Orientation;Attention;Memory;Safety/judgement;Awareness;Problem solving   Orientation Level: Disoriented to;Place;Time;Situation Current Attention Level: Sustained Memory: Decreased short-term memory Following Commands: Follows one step commands consistently Safety/Judgement: Decreased awareness of safety;Decreased awareness of deficits Awareness: Intellectual Problem Solving: Slow processing;Decreased initiation;Difficulty sequencing;Requires verbal cues;Requires tactile cues                     Pertinent Vitals/ Pain       Pain Assessment: Faces Faces Pain Scale: Hurts little more Pain Location: abdomen Pain Descriptors / Indicators:  Grimacing Pain Intervention(s): Limited  activity within patient's tolerance;Monitored during session;Repositioned   Frequency  Min 2X/week        Progress Toward Goals  OT Goals(current goals can now be found in the care plan section)  Progress towards OT goals: Progressing toward goals  Acute Rehab OT Goals Patient Stated Goal: To get out of here and go home OT Goal Formulation: With patient Time For Goal Achievement: 08/22/21 Potential to Achieve Goals: Fair  Plan Discharge plan remains appropriate;Frequency remains appropriate       AM-PAC OT "6 Clicks" Daily Activity     Outcome Measure   Help from another person eating meals?: A Little Help from another person taking care of personal grooming?: A Little Help from another person toileting, which includes using toliet, bedpan, or urinal?: Total Help from another person bathing (including washing, rinsing, drying)?: A Lot Help from another person to put on and taking off regular upper body clothing?: A Lot Help from another person to put on and taking off regular lower body clothing?: Total 6 Click Score: 12    End of Session    OT Visit Diagnosis: Unsteadiness on feet (R26.81);Other symptoms and signs involving cognitive function;Muscle weakness (generalized) (M62.81);History of falling (Z91.81);Repeated falls (R29.6);Feeding difficulties (R63.3)   Activity Tolerance Patient tolerated treatment well;Other (comment) (Limited to bed level ADL only per pt)   Patient Left in bed;with call bell/phone within reach;with bed alarm set   Nurse Communication Other (comment) (Pt in bed with bathing/grooming/oral care completed at bed level today.)        Time: 5038-8828 OT Time Calculation (min): 38 min  Charges: OT General Charges $OT Visit: 1 Visit OT Treatments $Self Care/Home Management : 38-52 mins   Alm Bustard, OTR/L 08/11/2021, 9:43 AM

## 2021-08-11 NOTE — TOC Transition Note (Signed)
Transition of Care [facility]) - CM/SW Discharge Note   Patient Details  Name: [name] MRN: [id number] Date of Birth: 12-Jan-1941  [facility] CM/SW Contact:  [name], [name], RN Phone Number: 08/11/2021, 1:08 PM   Clinical Narrative:   Received call from [name] with [facility] that a bed is ready for patient and they can accept him today. Discharge summary and all paperwork requested faxed and received. Patient is medically stable to be discharged per MD. Daughter [name] notified. PTAR called and patient is placed on the list. No further TOC needs at this time.   Final next level of care: Skilled Nursing Facility Barriers to Discharge: No Barriers Identified   Patient Goals and CMS Choice Patient states their goals for this hospitalization and ongoing recovery are:: To go to rehab CMS Medicare.gov Compare Post Acute Care list provided to:: Patient Choice offered to / list presented to : Patient, Adult Children (Daughter)  Discharge Placement              Patient chooses bed at: Other - please specify in the comment section below: [facility]) Patient to be transferred to facility by: PTAR Name of family member notified: Alfred Levins, daughter Patient and family notified of of transfer: 08/11/21  Discharge Plan and Services In-house Referral: Clinical Social Work Discharge Planning Services: CM Consult                                 Social Determinants of Health (SDOH) Interventions     Readmission Risk Interventions No flowsheet data found.

## 2021-08-11 NOTE — Progress Notes (Signed)
Pt refused all HS medications.

## 2021-08-11 NOTE — Progress Notes (Signed)
Gave report to [employer] at [employer]

## 2022-10-15 IMAGING — CT CT CERVICAL SPINE W/O CM
4 of 5 series · 14 of 35 positions shown, 16 images · non-contrast
Comparison: None.

CLINICAL DATA: Multiple recent falls.

EXAM:
CT CERVICAL SPINE WITHOUT CONTRAST
TECHNIQUE: Multidetector CT imaging of the cervical spine was performed without
intravenous contrast. Multiplanar CT image reconstructions were also
generated.

[Series 6: coronal bone · coronal · 0.30mm/px · 3 of 83 slices shown]
[im 22/83  bone]
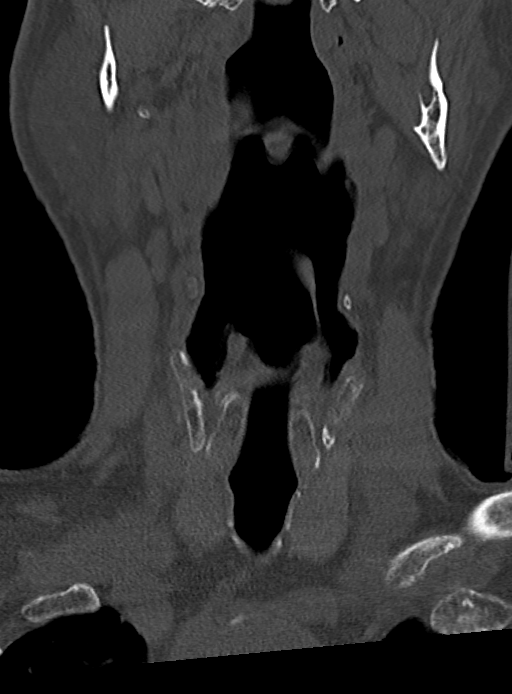
[im 35/83  bone]
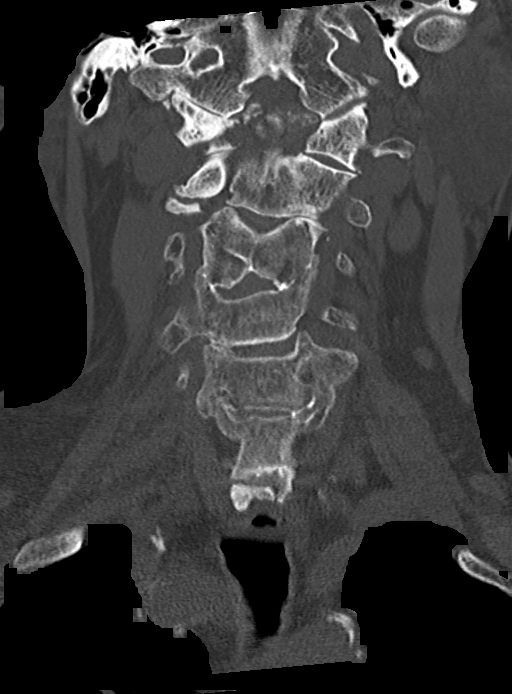
[im 48/83  bone]
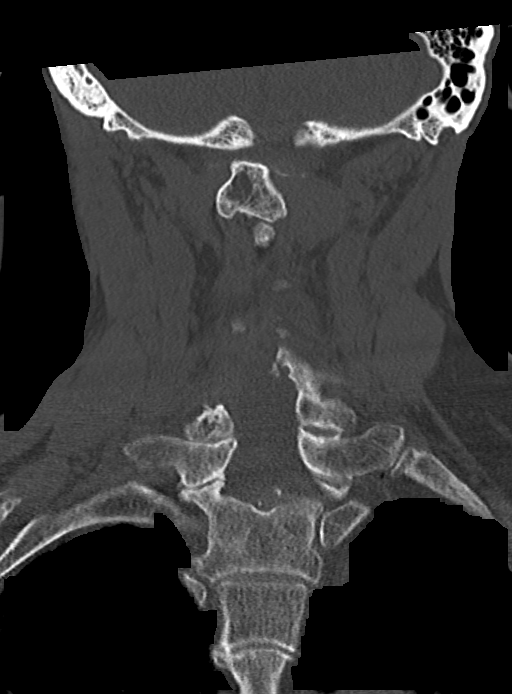

[Series 7: sagittal bone · sagittal · 0.36mm/px · 5 of 82 slices shown, 6 images]
[im 28/82  bone]
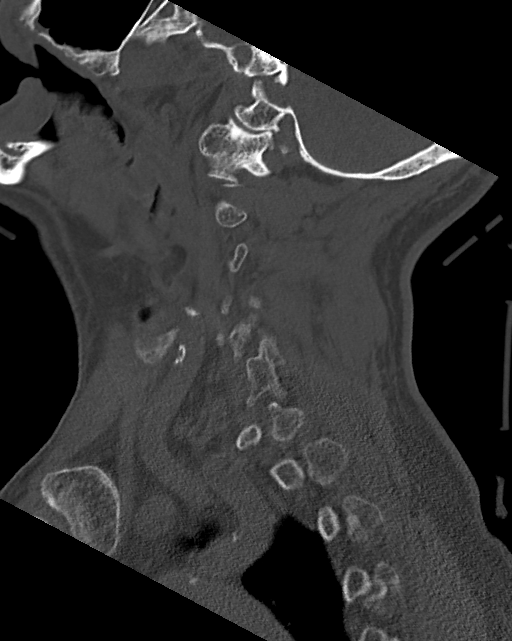
[im 34/82  bone]
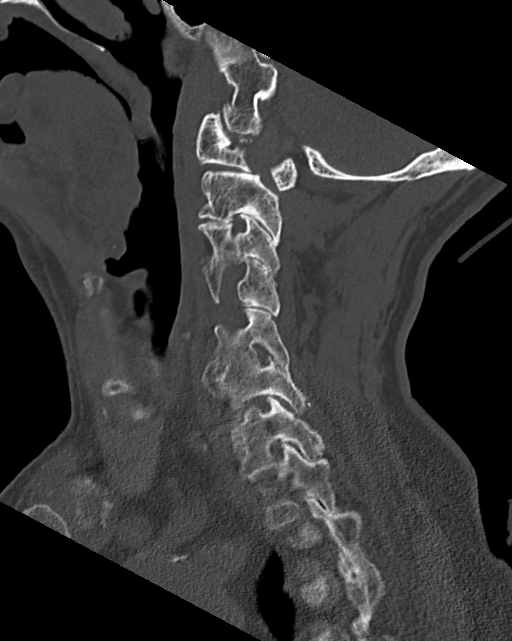
[im 41/82  soft-tissue]
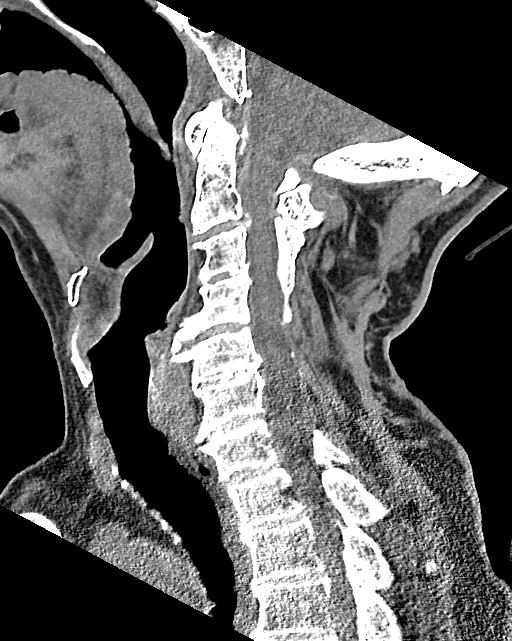
[im 41/82  bone]
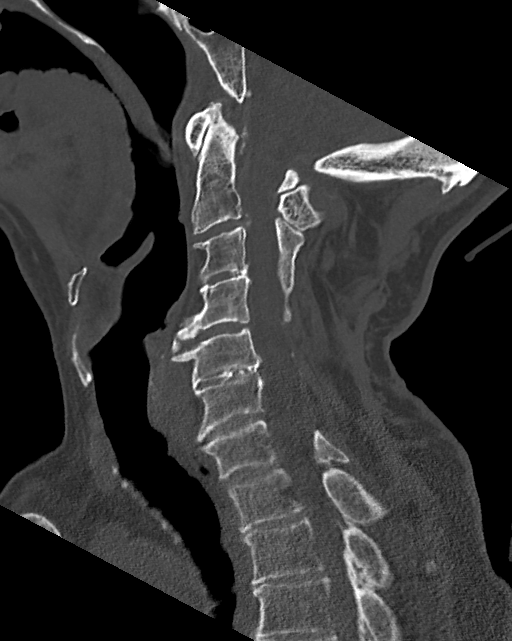
[im 48/82  bone]
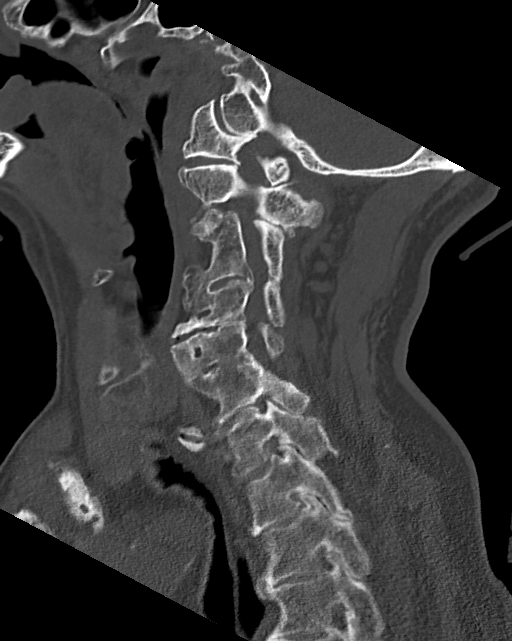
[im 55/82  bone]
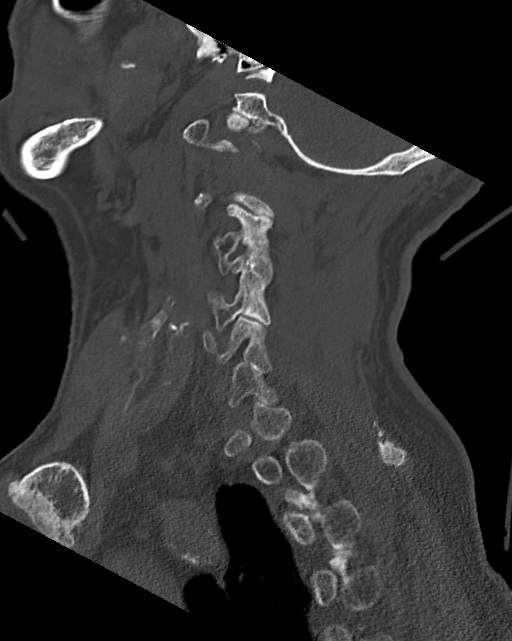

[Series 8: orthogonal axial bone · oblique · 0.21mm/px · 3 of 106 slices shown, 4 images]
[im 27/106  soft-tissue]
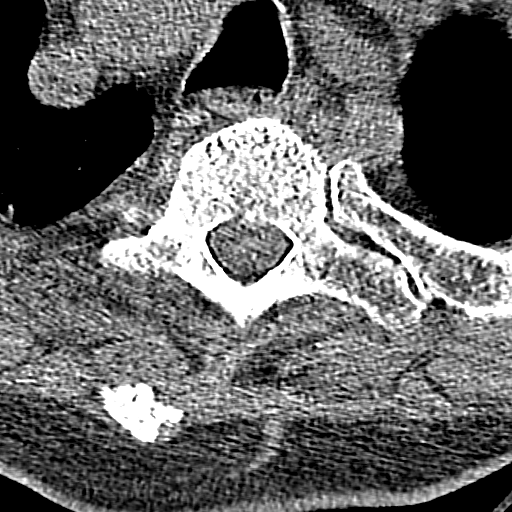
[im 27/106  bone]
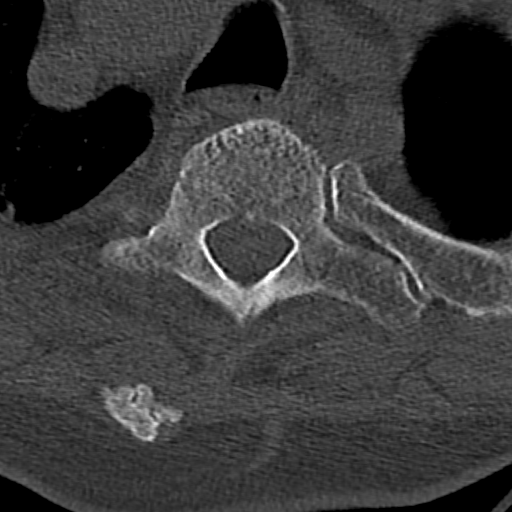
[im 53/106  bone]
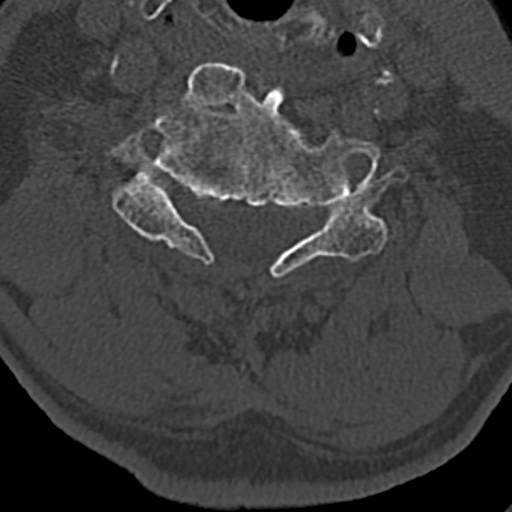
[im 79/106  bone]
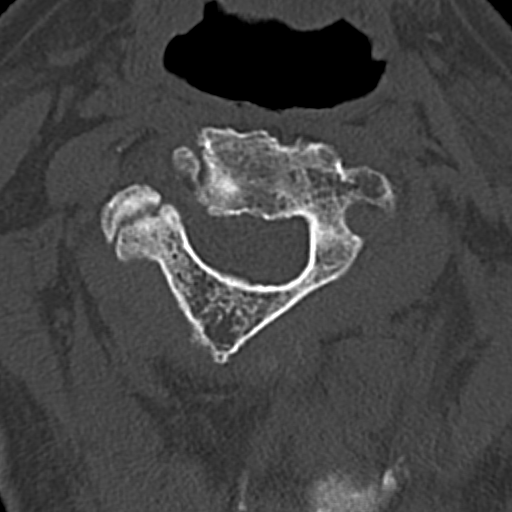

[Series 9: orthogonal axial st · oblique · 0.21mm/px · 3 of 106 slices shown]
[im 27/106  bone]
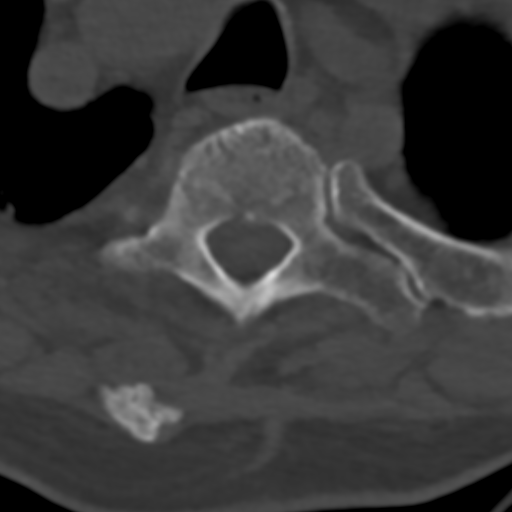
[im 53/106  bone]
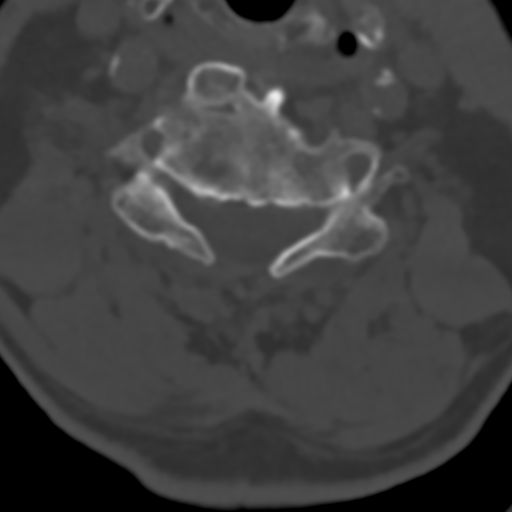
[im 79/106  bone]
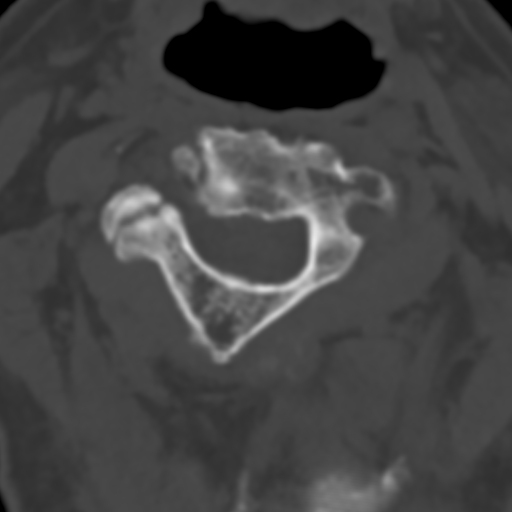

[14 of 35 positions shown; findings below may reference images not displayed]

FINDINGS: Alignment: Normal.

Skull base and vertebrae: No acute fracture. Chronic and
degenerative changes seen along the body and tip of the dens.
Postoperative changes are seen involving the posterior elements from
the level of C3 through C7.

Soft tissues and spinal canal: No prevertebral fluid or swelling. No
visible canal hematoma.

Disc levels: Moderate severity multilevel endplate sclerosis and
marked severity anterior osteophyte formation is seen at the levels
of C2-C3, C3-C4, C4-C5, C5-C6 and C6-C7.

There is marked severity narrowing of the anterior atlantoaxial
articulation. Marked severity intervertebral disc space narrowing is
seen at the level of C5-C6 with moderate severity multilevel
intervertebral disc space narrowing noted throughout the remainder
of the cervical spine.

Marked severity bilateral multilevel facet joint hypertrophy is
noted.

Upper chest: Negative.

Other: None.
IMPRESSION: 1. Extensive chronic, degenerative and postoperative changes, as
described above.
2. No evidence of an acute fracture or subluxation.

## 2024-04-09 DEATH — deceased
